# Patient Record
Sex: Female | Born: 1965 | Hispanic: Yes | Marital: Married | Smoking: Never smoker
Health system: Southern US, Community
[De-identification: ages and names within clinical notes are randomized; demographics above are authoritative.]

## PROBLEM LIST (undated history)

## (undated) DIAGNOSIS — M1990 Unspecified osteoarthritis, unspecified site: Secondary | ICD-10-CM

## (undated) DIAGNOSIS — H539 Unspecified visual disturbance: Secondary | ICD-10-CM

## (undated) DIAGNOSIS — E119 Type 2 diabetes mellitus without complications: Secondary | ICD-10-CM

## (undated) DIAGNOSIS — D599 Acquired hemolytic anemia, unspecified: Secondary | ICD-10-CM

## (undated) DIAGNOSIS — D649 Anemia, unspecified: Secondary | ICD-10-CM

## (undated) DIAGNOSIS — T7840XA Allergy, unspecified, initial encounter: Secondary | ICD-10-CM

## (undated) DIAGNOSIS — I10 Essential (primary) hypertension: Secondary | ICD-10-CM

## (undated) DIAGNOSIS — IMO0001 Reserved for inherently not codable concepts without codable children: Secondary | ICD-10-CM

## (undated) HISTORY — DX: Acquired hemolytic anemia, unspecified: D59.9

## (undated) HISTORY — DX: Allergy, unspecified, initial encounter: T78.40XA

## (undated) HISTORY — DX: Unspecified osteoarthritis, unspecified site: M19.90

---
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
## 2000-11-13 ENCOUNTER — Other Ambulatory Visit: Admission: RE | Admit: 2000-11-13 | Discharge: 2000-11-13 | Payer: Self-pay | Admitting: Obstetrics and Gynecology

## 2001-09-08 ENCOUNTER — Other Ambulatory Visit: Admission: RE | Admit: 2001-09-08 | Discharge: 2001-09-08 | Payer: Self-pay | Admitting: Obstetrics and Gynecology

## 2002-04-02 ENCOUNTER — Inpatient Hospital Stay (HOSPITAL_COMMUNITY): Admission: RE | Admit: 2002-04-02 | Discharge: 2002-04-04 | Payer: Self-pay | Admitting: Obstetrics and Gynecology

## 2004-06-17 HISTORY — PX: ANKLE SURGERY: SHX546

## 2004-09-27 ENCOUNTER — Ambulatory Visit (HOSPITAL_COMMUNITY): Admission: RE | Admit: 2004-09-27 | Discharge: 2004-09-27 | Payer: Self-pay | Admitting: Family Medicine

## 2006-11-11 ENCOUNTER — Inpatient Hospital Stay (HOSPITAL_COMMUNITY): Admission: RE | Admit: 2006-11-11 | Discharge: 2006-11-12 | Payer: Self-pay | Admitting: Dentistry

## 2007-01-21 ENCOUNTER — Ambulatory Visit (HOSPITAL_COMMUNITY): Admission: RE | Admit: 2007-01-21 | Discharge: 2007-01-21 | Payer: Self-pay | Admitting: Family Medicine

## 2008-10-11 ENCOUNTER — Ambulatory Visit (HOSPITAL_COMMUNITY): Admission: RE | Admit: 2008-10-11 | Discharge: 2008-10-11 | Payer: Self-pay | Admitting: Internal Medicine

## 2010-07-08 ENCOUNTER — Encounter: Payer: Self-pay | Admitting: Family Medicine

## 2010-07-31 ENCOUNTER — Other Ambulatory Visit: Payer: Self-pay

## 2010-07-31 DIAGNOSIS — Z139 Encounter for screening, unspecified: Secondary | ICD-10-CM

## 2010-08-06 ENCOUNTER — Ambulatory Visit (HOSPITAL_COMMUNITY)
Admission: RE | Admit: 2010-08-06 | Discharge: 2010-08-06 | Disposition: A | Discharge status: Home / Self Care | Payer: Self-pay | Source: Ambulatory Visit | Attending: Family Medicine | Admitting: Family Medicine

## 2010-08-06 DIAGNOSIS — Z1231 Encounter for screening mammogram for malignant neoplasm of breast: Secondary | ICD-10-CM | POA: Insufficient documentation

## 2010-08-06 DIAGNOSIS — Z139 Encounter for screening, unspecified: Secondary | ICD-10-CM

## 2010-10-30 NOTE — Op Note (Signed)
Hannah Dickson, Hannah Dickson  ACCOUNT NO.:  [id number]   MEDICAL RECORD NO.:  [id number]          PATIENT TYPE:  INP   LOCATION:  A313                          FACILITY:  [facility]   PHYSICIAN:  J. Darreld Mclean, M.D. DATE OF BIRTH:  23-Nov-1965   DATE OF PROCEDURE:  11/11/2006  DATE OF DISCHARGE:                               OPERATIVE REPORT   PREOPERATIVE DIAGNOSIS:  Bimalleolar fracture, left ankle.   POSTOPERATIVE DIAGNOSIS:  Bimalleolar fracture, left ankle.   OPERATION PERFORMED:  Open reduction internal fixation, left ankle  fracture medial malleolus side only.   SURGEON:  J. Darreld Mclean, M.D.   ANESTHESIA:  Spinal.   TOURNIQUET TIME:  Please refer to anesthesia record.   INDICATIONS FOR PROCEDURE:  The patient is a 45 year old female, hurt  her ankle around the 15th of May.  She delayed getting treatment.  She  was subsequently seen by Dr. Henderson Newcomer in town.  X-rays were taken.  There  was another delay and I saw her in the office this past Friday.  She had  a bimalleolar fracture of the ankle.  Recommended surgery.  Monday  yesterday was a holiday.  Today was first operative day that we could do  this.   I discussed the risks and imponderables of the procedure.  The patient  can not speak [place]. She had a Nurse, learning disability and I explained it in detail  with them.  Risks and imponderables were discussed and she appeared to  understand.  I explained I will do the medial side, it may be the  lateral depending on the stability.   DESCRIPTION OF PROCEDURE:  The patient was seen in the holding area.  The left ankle was identified as the correct surgical site.  I placed a  mark, she placed a mark.  Translator was present.  Dr. Marcos Eke speaks  Spanish fluently and I talked with her about the anesthesia.  The  patient was brought to the operating room given a spinal anesthesia and  placed supine.  Sandbag was placed up under the left hip.  Tourniquet  placed deflated to left  upper thigh.  She was prepped and draped usual  manner.  We had time out identifying Ms. Hannah Dickson as the patient and left  ankle as correct surgical site.  The leg was elevated and wrapped  circumferentially with Esmarch bandage. Tourniquet inflated to 250 mmHg.  Esmarch bandage removed.  Medial incision made with careful dissection,  fracture site was identified.  Part of the deltoid ligament had gone  within the joint prohibiting bony union.  This was removed.  Ankle was  debrided, settled anatomically in position and a smooth 0.062 K-wire was  inserted.  Then a 3.2 mm drill was used and a 4.5 x 55 mm malleolar long  screw was inserted.  C-arm fluoroscopy unit was used and position was  anatomic for reduction.  Positioning of screw looked good.  Lateral  views were taken.  Fracture was well reduced laterally and medially.  I  elected not to open the lateral side as the mortise looked excellent and  the lateral fragments were in anatomical position.  I don't see how I  could improve it any better.  Wounds medially were  reapproximated using 2-0 chromic, 2-0 plain and skin staples.  Sterile  dressing applied.  Tourniquet deflated.  Please refer anesthesia record  tourniquet time.  Bulky dressing applied.  Posterior splint applied.  Tolerated procedure well.  She will be put in for observation tonight.           ______________________________  J. Darreld Mclean, M.D.     JWK/MEDQ  D:  11/11/2006  T:  11/11/2006  Job:  [id number]

## 2010-10-30 NOTE — H&P (Signed)
NAME:  Hannah Dickson, Hannah Dickson  ACCOUNT NO.:  [id number]   MEDICAL RECORD NO.:  [id number]          PATIENT TYPE:  AMB   LOCATION:  DAY                           FACILITY:  [facility]   PHYSICIAN:  J. Darreld Mclean, M.D. DATE OF BIRTH:  November 27, 1965   DATE OF ADMISSION:  DATE OF DISCHARGE:  LH                              HISTORY & PHYSICAL   CHIEF COMPLAINT:  I broke my ankle.   HISTORY OF PRESENT ILLNESS:  The patient is a 45 year old female who  injured her ankle around May 14 or May 15.  She had pain and tenderness.  She was subsequently seen by Dr. Henderson Newcomer.  Dr. Henderson Newcomer obtained x-rays of  her ankle which shows a bimalleolar fracture of ankle displaced.  There  was a several-day delay.  The patient was seen in my office on May 23.  She comes in with crutches.  She has no splint and no other protective  devices.  The patient speaks very little [place], but she has a  Nurse, learning disability with her.  The patient denies any other injuries.   PAST MEDICAL HISTORY:  Negative.   ALLERGIES:  She has no allergies.   PAST SURGICAL HISTORY:  She is status post C-section twice, one in 2000  and one in 2003.  She does not use alcohol.  She does not smoke.  She  has no family physician.  She is in the process of getting her green  card.  She lives in [place].   PHYSICAL EXAM:  VITAL SIGNS:  BP is 120/72, pulse 72, respirations 16  and afebrile.  GENERAL:  The patient is alert, cooperative and oriented.  HEENT:  Negative.  NECK:  Supple.  LUNGS:  Clear to percussion and auscultation.  HEART:  Regular rate and rhythm without murmur heard.  ABDOMEN:  Soft and nontender without masses.  EXTREMITIES:  Left ankle swollen and tender with decreased range of  motion and pain.  Other extremities within normal limits.  CNS:  Intact.  SKIN:  Intact.   IMPRESSION:  Bimalleolar fracture of the left angle, displaced, 8 days  old.   PLAN:  Open treatment and internal reduction of the left ankle by  surgery  on Tuesday.  I have told the family this is several days old and  is not an emergency.  Operating room is closed on Monday for [facility] and will do this Tuesday, the first available date.  They appeared  to understand and agreed to proceed as outlined.  She will stay  overnight in the hospital the first night and maybe the second night.  We will see how she does after the first night and physical therapy will  see her and get her up and ambulating.  They appeared to understand.  I  went over the risks and imponderables in great detail with them.  Labs  are pending.                                            ______________________________  J. Darreld Mclean, M.D.  JWK/MEDQ  D:  11/10/2006  T:  11/10/2006  Job:  [id number]

## 2010-11-02 NOTE — Op Note (Signed)
   Hannah Dickson, Hannah Dickson                       ACCOUNT NO.:  [id number]   MEDICAL RECORD NO.:  [id number]                   PATIENT TYPE:  INP   LOCATION:  A417                                 FACILITY:  [facility]   PHYSICIAN:  Tilda Burrow, M.D.              DATE OF BIRTH:  1965/08/06   DATE OF PROCEDURE:  04/02/2002  DATE OF DISCHARGE:  04/04/2002                                 OPERATIVE REPORT   PREOPERATIVE DIAGNOSES:  Pregnancy 39 weeks, prior cesarean section now for  trial of labor, elective permanent sterilization.   POSTOPERATIVE DIAGNOSES:  Pregnancy 39 weeks, prior cesarean section now for  trial of labor, elective permanent sterilization, delivered.   PROCEDURE:  Repeat low transverse cervical cesarean section, bilateral  partial salpingectomy.   SURGEON:  Tilda Burrow, M.D.   ASSISTANT:  None.   ANESTHESIA:  Spinal.   COMPLICATIONS:  None.   ESTIMATED BLOOD LOSS:  500 cc   DESCRIPTION OF PROCEDURE:  The patient was taken to the operating room,  prepped and draped for repeat cesarean section with spinal anesthesia  introduced. A lower abdominal Pfannenstiel type incision was repeated with  easy entry into the abdominal cavity. A bladder flap was developed on the  lower uterine segment and transverse uterine incision performed. The fetal  vertex was rotated into the incision and delivered using fundal pressure.  The cord was clamped, the infant passed to the waiting physician. The  placenta was then delivered after cord blood samples obtained. The uterus  was irrigated with an antibiotic containing solution followed by a single  layer of running locking closure of the uterine incision. The abdomen was  irrigated with an antibiotic solution, the anterior peritoneum closed using  2-0 Chromic, the fascia closed using #0 Vicryl and subcutaneous tissues  reapproximated with interrupted 2-0 Chromic. Staple closure of the skin  completed the procedure.   ADDENDUM:  After closure of the uterine incision, attention was directed to  the fallopian tubes which were identifiable on each side in sequence. The  right tube was grasped at its mid portion, elevated and confirmed to its  fimbriated end, doubly ligated around the mid segment knuckle of tube using  2-0 Chromic with excision of the incarcerated mid segment knuckle of tube.  The opposite tube was treated similarly.                                               Tilda Burrow, M.D.    JVF/MEDQ  D:  05/20/2002  T:  05/20/2002  Job:  [id number]   cc:   [facility] Dept.

## 2010-11-02 NOTE — H&P (Signed)
   NAMEJENNE, Dickson                       ACCOUNT NO.:  [id number]   MEDICAL RECORD NO.:  [id number]                   PATIENT TYPE:   LOCATION:                                       FACILITY:   PHYSICIAN:  Tilda Burrow, M.D.              DATE OF BIRTH:  [birth year]   DATE OF ADMISSION:  04/02/2002  DATE OF DISCHARGE:                                HISTORY & PHYSICAL   ADMITTING DIAGNOSIS:  Pregnancy 38-1/[redacted] weeks gestation, prior cesarean  section, not for trial of labor, elected permanent sterilization.   HISTORY OF PRESENT ILLNESS:  This 45 year old Hispanic female gravida 2,  para 1, last menstrual period June 09, 2001, placing [facility] at March 16, 2002 with ultrasound March 25 placing [facility] of December 24 at first  trimester and confirmed later in pregnancy with identical measurements at [redacted]  weeks gestation is admitted at [redacted] weeks gestation for a repeat cesarean  section and tubal ligation.  The patient has signed Medicaid sterilization  consent forms in our office and acknowledges the permanency of requested  procedure.   PAST MEDICAL HISTORY:  Benign.   PAST SURGICAL HISTORY:  Primary cesarean section 2001 after failed induction  for premature rupture of membranes at term.   ALLERGIES:  None.   SOCIAL HISTORY:  Married, works at [employer].   PHYSICAL EXAMINATION:  VITAL SIGNS:  Height 4 feet 10 inches, weight 160  pounds, blood pressure 90/58.  GENERAL:  A healthy, cheerful, energetic Hispanic female alert and oriented  x3.  Pupils equal, round, and reactive.  NECK:  Supple.  CHEST:  Clear to auscultation.  ABDOMEN:  A 37-cm fundal height, gravid uterus, vertex presentation of  infant.  Cervix closed, long, and high at last exam.   PRENATAL LABORATORY DATA:  Blood type O+, urine drug screen negative,  rubella immunity.  Present hemoglobin 12, hematocrit 38.  Hepatitis, HIV,  RPR, GC, Chlamydia all negative.  MSAFP normal.  Glucose tolerance test  not  performed as scheduled.  At 36 weeks, a modified glucose challenge test with  a soft drink resulted in a one-hour blood sugar of 237 but the patient's  subsequent visits never showed glucosuria.   IMPRESSION:  1. Pregnancy 39 weeks.  2. Glucose intolerance third trimester.  3.     Desire for elective permanent sterilization.  4. Repeat cesarean section.   PLAN:  Cesarean section with sterilization on April 02, 2002.                                               Tilda Burrow, M.D.    JVF/MEDQ  D:  03/23/2002  T:  03/24/2002  Job:  [id number]   cc:   [facility] Department

## 2010-11-02 NOTE — Discharge Summary (Signed)
Hannah Dickson, MOOR  ACCOUNT NO.:  [id number]   MEDICAL RECORD NO.:  [id number]          PATIENT TYPE:  INP   LOCATION:  A313                          FACILITY:  [facility]   PHYSICIAN:  J. Darreld Mclean, M.D. DATE OF BIRTH:  [birth year]   DATE OF ADMISSION:  11/11/2006  DATE OF DISCHARGE:  05/28/2008LH                               DISCHARGE SUMMARY   DISCHARGE DIAGNOSIS:  Bimalleolar fracture of the ankle on the left.   CONDITION ON DISCHARGE:  Improved.  Prognosis is good.   HOSPITAL COURSE:  The patient had a fracture of her ankle on May 14, or  May 15.  I saw her in the office for the first time on May 23.  Surgery  was scheduled for May 26, after the [facility] Day holiday.  She had open  reduction internal fixation of the left ankle of the medial malleolus  only.  The lateral malleolus was in good position and alignment.  Surgery was done on May 27, the day of admission.  She tolerated this  well.  On the next day, she was doing well and had very little pain,  getting about with physical therapy.  She was instructed to have limited  weightbearing on foot to none.   DISCHARGE MEDICATION:  Vicodin ES p.r.n. pain.   FOLLOW UP:  I will see her in the office on June 11.  She can contact me  through the office and hospital beeper system numbers have been  provided.                                            ______________________________  [name]. Darreld Mclean, M.D.     JWK/MEDQ  D:  12/04/2006  T:  12/04/2006  Job:  [id number]

## 2011-09-12 ENCOUNTER — Other Ambulatory Visit (HOSPITAL_COMMUNITY): Payer: Self-pay | Admitting: Family Medicine

## 2011-09-12 DIAGNOSIS — Z139 Encounter for screening, unspecified: Secondary | ICD-10-CM

## 2011-09-26 ENCOUNTER — Ambulatory Visit (HOSPITAL_COMMUNITY)
Admission: RE | Admit: 2011-09-26 | Discharge: 2011-09-26 | Disposition: A | Discharge status: Home / Self Care | Payer: Self-pay | Source: Ambulatory Visit | Attending: Family Medicine | Admitting: Family Medicine

## 2011-09-26 DIAGNOSIS — Z139 Encounter for screening, unspecified: Secondary | ICD-10-CM

## 2012-06-11 ENCOUNTER — Ambulatory Visit: Payer: Self-pay | Admitting: Family Medicine

## 2012-06-11 VITALS — BP 132/85 | HR 64 | Temp 98.0°F | Resp 17 | Ht 58.5 in | Wt 140.0 lb

## 2012-06-11 DIAGNOSIS — H6690 Otitis media, unspecified, unspecified ear: Secondary | ICD-10-CM

## 2012-06-11 DIAGNOSIS — H6120 Impacted cerumen, unspecified ear: Secondary | ICD-10-CM

## 2012-06-11 MED ORDER — AMOXICILLIN 875 MG PO TABS: 875.0000 mg | ORAL_TABLET | Freq: Two times a day (BID) | ORAL | Status: DC

## 2012-06-11 NOTE — Progress Notes (Signed)
  [facility]:  Office Visit  Chief Complaint:  Chief Complaint  Patient presents with  . Otalgia    left side 2 days     HPI: Hannah Dickson is a 46 y.o. female who complains of  2 day history, left ear pain. Can't sleep. She has not had fevers. Has had pain going down her neck and also HAs. No other sxs. No recent swimming or underwater exposure/   Past Medical History  Diagnosis Date  . Allergy   . Arthritis    History reviewed. No pertinent past surgical history. History   Social History  . Marital Status: Married    Spouse Name: N/A    Number of Children: N/A  . Years of Education: N/A   Social History Main Topics  . Smoking status: Never Smoker   . Smokeless tobacco: None  . Alcohol Use: No  . Drug Use: No  . Sexually Active: No   Other Topics Concern  . None   Social History Narrative  . None   History reviewed. No pertinent family history. No Known Allergies Prior to Admission medications   Not on File     ROS: The patient denies fevers, chills, night sweats, unintentional weight loss, chest pain, palpitations, wheezing, dyspnea on exertion, nausea, vomiting, abdominal pain, dysuria, hematuria, melena, numbness, weakness, or tingling.   All other systems have been reviewed and were otherwise negative with the exception of those mentioned in the HPI and as above.    PHYSICAL EXAM: Filed Vitals:   06/11/12 1054  BP: 132/85  Pulse: 64  Temp: 98 F (36.7 C)  Resp: 17   Filed Vitals:   06/11/12 1054  Height: 4' 10.5" (1.486 m)  Weight: 140 lb (63.504 kg)   Body mass index is 28.76 kg/(m^2).  General: Alert, no acute distress HEENT:  Normocephalic, atraumatic, oropharynx patent. Left TM unable to visualize, there is fluid, and black/brown cerumen. After disimpaction TM not visualized, non present.  She has exudates Cardiovascular:  Regular rate and rhythm, no rubs murmurs or gallops.  No Carotid bruits, radial pulse  intact. No pedal edema.  Respiratory: Clear to auscultation bilaterally.  No wheezes, rales, or rhonchi.  No cyanosis, no use of accessory musculature GI: No organomegaly, abdomen is soft and non-tender, positive bowel sounds.  No masses. Skin: No rashes. Neurologic: Facial musculature symmetric. Psychiatric: Patient is appropriate throughout our interaction. Lymphatic: No cervical lymphadenopathy Musculoskeletal: Gait intact.   LABS: No results found for this or any previous visit.   EKG/XRAY:   Primary read interpreted by Dr. Conley Rolls at [facility].   ASSESSMENT/PLAN: Encounter Diagnoses  Name Primary?  . Acute otitis media Yes  . Cerumen impaction    Rx Amoxacillin 875 mg PO BID Work note given 06/12/12, return to work on 06/13/12 F/u prn     ,  PHUONG, DO 06/11/2012 12:18 PM

## 2012-12-03 ENCOUNTER — Other Ambulatory Visit (HOSPITAL_COMMUNITY): Payer: Self-pay | Admitting: *Deleted

## 2012-12-03 DIAGNOSIS — Z09 Encounter for follow-up examination after completed treatment for conditions other than malignant neoplasm: Secondary | ICD-10-CM

## 2012-12-10 ENCOUNTER — Ambulatory Visit (HOSPITAL_COMMUNITY)
Admission: RE | Admit: 2012-12-10 | Discharge: 2012-12-10 | Disposition: A | Discharge status: Home / Self Care | Payer: Self-pay | Source: Ambulatory Visit | Attending: *Deleted | Admitting: *Deleted

## 2012-12-10 DIAGNOSIS — Z09 Encounter for follow-up examination after completed treatment for conditions other than malignant neoplasm: Secondary | ICD-10-CM

## 2014-01-25 ENCOUNTER — Other Ambulatory Visit (HOSPITAL_COMMUNITY): Payer: Self-pay | Admitting: *Deleted

## 2014-01-25 DIAGNOSIS — Z1231 Encounter for screening mammogram for malignant neoplasm of breast: Secondary | ICD-10-CM

## 2014-01-31 ENCOUNTER — Ambulatory Visit (HOSPITAL_COMMUNITY)
Admission: RE | Admit: 2014-01-31 | Discharge: 2014-01-31 | Disposition: A | Discharge status: Home / Self Care | Payer: PRIVATE HEALTH INSURANCE | Source: Ambulatory Visit | Attending: *Deleted | Admitting: *Deleted

## 2014-01-31 DIAGNOSIS — Z1231 Encounter for screening mammogram for malignant neoplasm of breast: Secondary | ICD-10-CM

## 2015-02-17 ENCOUNTER — Inpatient Hospital Stay (HOSPITAL_COMMUNITY): Payer: PRIVATE HEALTH INSURANCE

## 2015-02-17 ENCOUNTER — Inpatient Hospital Stay (HOSPITAL_COMMUNITY)
Admission: AD | Admit: 2015-02-17 | Discharge: 2015-02-20 | DRG: 123 | Disposition: A | Discharge status: Home / Self Care | Payer: Self-pay | Source: Ambulatory Visit | Attending: Internal Medicine | Admitting: Internal Medicine

## 2015-02-17 ENCOUNTER — Encounter (HOSPITAL_COMMUNITY): Payer: Self-pay | Admitting: General Practice

## 2015-02-17 DIAGNOSIS — Z794 Long term (current) use of insulin: Secondary | ICD-10-CM

## 2015-02-17 DIAGNOSIS — H469 Unspecified optic neuritis: Principal | ICD-10-CM | POA: Insufficient documentation

## 2015-02-17 DIAGNOSIS — Z79899 Other long term (current) drug therapy: Secondary | ICD-10-CM

## 2015-02-17 DIAGNOSIS — H547 Unspecified visual loss: Secondary | ICD-10-CM

## 2015-02-17 DIAGNOSIS — IMO0002 Reserved for concepts with insufficient information to code with codable children: Secondary | ICD-10-CM

## 2015-02-17 DIAGNOSIS — E119 Type 2 diabetes mellitus without complications: Secondary | ICD-10-CM | POA: Diagnosis present

## 2015-02-17 DIAGNOSIS — M1990 Unspecified osteoarthritis, unspecified site: Secondary | ICD-10-CM | POA: Diagnosis present

## 2015-02-17 DIAGNOSIS — E1165 Type 2 diabetes mellitus with hyperglycemia: Secondary | ICD-10-CM

## 2015-02-17 HISTORY — DX: Type 2 diabetes mellitus without complications: E11.9

## 2015-02-17 LAB — COMPREHENSIVE METABOLIC PANEL
ALT: 37 U/L (ref 14–54)
ANION GAP: 7 (ref 5–15)
AST: 33 U/L (ref 15–41)
Albumin: 4 g/dL (ref 3.5–5.0)
Alkaline Phosphatase: 86 U/L (ref 38–126)
BILIRUBIN TOTAL: 0.4 mg/dL (ref 0.3–1.2)
BUN: 10 mg/dL (ref 6–20)
CHLORIDE: 105 mmol/L (ref 101–111)
CO2: 24 mmol/L (ref 22–32)
Calcium: 8.7 mg/dL — ABNORMAL LOW (ref 8.9–10.3)
Creatinine, Ser: 0.59 mg/dL (ref 0.44–1.00)
Glucose, Bld: 117 mg/dL — ABNORMAL HIGH (ref 65–99)
POTASSIUM: 4 mmol/L (ref 3.5–5.1)
Sodium: 136 mmol/L (ref 135–145)
TOTAL PROTEIN: 7 g/dL (ref 6.5–8.1)

## 2015-02-17 LAB — CBC
HEMATOCRIT: 32.7 % — AB (ref 36.0–46.0)
Hemoglobin: 10.9 g/dL — ABNORMAL LOW (ref 12.0–15.0)
MCH: 30.4 pg (ref 26.0–34.0)
MCHC: 33.3 g/dL (ref 30.0–36.0)
MCV: 91.1 fL (ref 78.0–100.0)
PLATELETS: 139 10*3/uL — AB (ref 150–400)
RBC: 3.59 MIL/uL — ABNORMAL LOW (ref 3.87–5.11)
RDW: 14.1 % (ref 11.5–15.5)
WBC: 2.5 10*3/uL — ABNORMAL LOW (ref 4.0–10.5)

## 2015-02-17 LAB — GLUCOSE, CAPILLARY: GLUCOSE-CAPILLARY: 125 mg/dL — AB (ref 65–99)

## 2015-02-17 MED ORDER — ALUM & MAG HYDROXIDE-SIMETH 200-200-20 MG/5ML PO SUSP: 30.0000 mL | Freq: Four times a day (QID) | ORAL | Status: DC | PRN

## 2015-02-17 MED ORDER — SODIUM CHLORIDE 0.9 % IJ SOLN: 3.0000 mL | INTRAMUSCULAR | Status: DC | PRN

## 2015-02-17 MED ORDER — ACETAMINOPHEN 650 MG RE SUPP: 650.0000 mg | Freq: Four times a day (QID) | RECTAL | Status: DC | PRN

## 2015-02-17 MED ORDER — ONDANSETRON HCL 4 MG/2ML IJ SOLN: 4.0000 mg | Freq: Four times a day (QID) | INTRAMUSCULAR | Status: DC | PRN

## 2015-02-17 MED ORDER — ONDANSETRON HCL 4 MG PO TABS: 4.0000 mg | ORAL_TABLET | Freq: Four times a day (QID) | ORAL | Status: DC | PRN

## 2015-02-17 MED ORDER — SODIUM CHLORIDE 0.9 % IV SOLN: 250.0000 mL | INTRAVENOUS | Status: DC | PRN

## 2015-02-17 MED ORDER — ENOXAPARIN SODIUM 40 MG/0.4ML ~~LOC~~ SOLN
40.0000 mg | SUBCUTANEOUS | Status: DC
  Administered 2015-02-17 – 2015-02-19 (×3): 40 mg via SUBCUTANEOUS
  Filled 2015-02-17 (×3): qty 0.4

## 2015-02-17 MED ORDER — INSULIN ASPART 100 UNIT/ML ~~LOC~~ SOLN
0.0000 [IU] | Freq: Three times a day (TID) | SUBCUTANEOUS | Status: DC
  Administered 2015-02-18: 2 [IU] via SUBCUTANEOUS
  Administered 2015-02-18: 11 [IU] via SUBCUTANEOUS
  Administered 2015-02-18: 5 [IU] via SUBCUTANEOUS
  Administered 2015-02-19 – 2015-02-20 (×4): 8 [IU] via SUBCUTANEOUS

## 2015-02-17 MED ORDER — INSULIN ASPART 100 UNIT/ML ~~LOC~~ SOLN
0.0000 [IU] | Freq: Every day | SUBCUTANEOUS | Status: DC
  Administered 2015-02-18: 4 [IU] via SUBCUTANEOUS
  Administered 2015-02-19: 5 [IU] via SUBCUTANEOUS

## 2015-02-17 MED ORDER — SODIUM CHLORIDE 0.9 % IJ SOLN
3.0000 mL | Freq: Two times a day (BID) | INTRAMUSCULAR | Status: DC
  Administered 2015-02-17 – 2015-02-20 (×6): 3 mL via INTRAVENOUS

## 2015-02-17 MED ORDER — ACETAMINOPHEN 325 MG PO TABS
650.0000 mg | ORAL_TABLET | Freq: Four times a day (QID) | ORAL | Status: DC | PRN
  Administered 2015-02-18: 650 mg via ORAL
  Filled 2015-02-17: qty 2

## 2015-02-17 NOTE — Consult Note (Signed)
NEURO HOSPITALIST CONSULT NOTE   Referring physician: Dr Vanessa Barbara Reason for Consult: blurred vision and pain right eye  HPI:                                                                                                                                          Hannah Dickson is an 49 y.o. female with a past medical history that is relevant for DM type II, and arthritis, sent as a direct admission due to new onset of the above stated symptoms. Patient indicated that 10 or 12 days ago she developed acute onset of pain in the right eye that was followed by blurred vision and have been present ever since. Never had similar symptoms before. She sad that in the beginning the pain will get worse with eye movements but no anymore. Denies associated vision loss, periorbital swelling, HA, vertigo, double vision, focal weakness, slurred speech, imbalance, or language impairment. No eye trauma, fever, or infection. She was evaluated today by ophthalmology and sent to the hospital due to concern for optic neuritis.    Past Medical History  Diagnosis Date  . Allergy   . Arthritis   . Diabetes mellitus without complication     Past Surgical History  Procedure Laterality Date  . Ankle surgery Left 2006    History reviewed. No pertinent family history.  Family History: no MS, brain tumor, epilepsy, or brain aneurysm   Social History:  reports that she has never smoked. She has never used smokeless tobacco. She reports that she does not drink alcohol or use illicit drugs.  No Known Allergies  MEDICATIONS:                                                                                                                     I have reviewed the patient's current medications.   ROS:  History obtained from chart review and the  patient  General ROS: negative for - chills, fatigue, fever, night sweats, or weight loss Psychological ROS: negative for - behavioral disorder, hallucinations, memory difficulties, mood swings or suicidal ideation Ophthalmic ROS: negative for - double vision or or loss of vision ENT ROS: negative for - epistaxis, nasal discharge, oral lesions, sore throat, tinnitus or vertigo Allergy and Immunology ROS: negative for - hives or itchy/watery eyes Hematological and Lymphatic ROS: negative for - bleeding problems, bruising or swollen lymph nodes Endocrine ROS: negative for - galactorrhea, hair pattern changes, polydipsia/polyuria or temperature intolerance Respiratory ROS: negative for - cough, hemoptysis, shortness of breath or wheezing Cardiovascular ROS: negative for - chest pain, dyspnea on exertion, edema or irregular heartbeat Gastrointestinal ROS: negative for - abdominal pain, diarrhea, hematemesis, nausea/vomiting or stool incontinence Genito-Urinary ROS: negative for - dysuria, hematuria, incontinence or urinary frequency/urgency Musculoskeletal ROS: negative for - joint swelling or muscular weakness Neurological ROS: as noted in HPI Dermatological ROS: negative for rash and skin lesion changes   Physical exam:  Constitutional: well developed, pleasant female in no apparent distress. Blood pressure 147/77, pulse 69, temperature 98.2 F (36.8 C), temperature source Oral, resp. rate 18, height  (1.372 m), weight 66.8 kg (147 lb 4.3 oz), SpO2 100 %. Eyes: no jaundice, proptosis, or exophthalmos.  Head: normocephalic. Neck: supple, no bruits, no JVD. Cardiac: no murmurs. Lungs: clear. Abdomen: soft, no tender, no mass. Extremities: no edema, clubbing, or cyanosis.  Skin: no rash  Neurologic Examination:                                                                                                      General: Mental Status: Alert, oriented, thought content appropriate.   Speech fluent without evidence of aphasia.  Able to follow 3 step commands without difficulty. Cranial Nerves: II: Discs flat bilaterally;  Visual acuity 20/20 bilaterally, right APD, visual fields grossly normal, pupils equal, round, reactive to light and accommodation III,IV, VI: ptosis not present, extra-ocular motions intact bilaterally V,VII: smile symmetric, facial light touch sensation normal bilaterally VIII: hearing normal bilaterally IX,X: uvula rises symmetrically XI: bilateral shoulder shrug XII: midline tongue extension without atrophy or fasciculations Motor: Right : Upper extremity   5/5    Left:     Upper extremity   5/5  Lower extremity   5/5     Lower extremity   5/5 Tone and bulk:normal tone throughout; no atrophy noted Sensory: Pinprick and light touch intact throughout, bilaterally Deep Tendon Reflexes:  Right: Upper Extremity   Left: Upper extremity   biceps (C-5 to C-6) 2/4   biceps (C-5 to C-6) 2/4 tricep (C7) 2/4    triceps (C7) 2/4 Brachioradialis (C6) 2/4  Brachioradialis (C6) 2/4  Lower Extremity Lower Extremity  quadriceps (L-2 to L-4) 2/4   quadriceps (L-2 to L-4) 2/4 Achilles (S1) 2/4   Achilles (S1) 2/4  Plantars: Right: downgoing   Left: downgoing Cerebellar: normal finger-to-nose,  normal heel-to-shin test Gait:  No ataxia  No results found for: CHOL  No results found  for this or any previous visit (from the past 48 hour(s)).  No results found.  Assessment/Plan: 49 y/o with new onset painful blurred vision right eye that is improving. Visual acuity intact but seems to have a right APD. Concern for right optic neuritis. Recommend: MRI brain and orbits with and without contrast. Patient is improving, hold steroids after MRI. Will follow up.  Wyatt Portela, MD 02/17/2015, 5:02 PM

## 2015-02-17 NOTE — Progress Notes (Signed)
Patient direct admit from home oriented and policy on her personal items  Given MD made aware that patient is here

## 2015-02-17 NOTE — H&P (Signed)
[facility] History and Physical  Hannah Dickson BJY:[id number] DOB: December 05, 1965 DOA: 02/17/2015  Referring physician:  PCP: Pcp Not In System   Chief Complaint: Right eye blurriness  HPI: Hannah Dickson is a 49 y.o. female with a past medical history of diabetes mellitus who presents as a direct admit from Dr. Allyne Gee office for further evaluation of right eye visual loss. Patient reporting a 10 day history of right-sided blurry vision associated with ocular pain. She states that symptoms have actually improved over the past several days. She also complains of associated fatigue particularly at the end of the day. She denies numbness, tingling, muscle weakness, spasticity, gait abnormalities, urinary symptoms. There was concern that symptoms could be secondary to an mass for which Dr Allyne Gee requested MRI of brain and neurology consultation. She denies having prior episodes of visual loss or blurriness.                                             Review of Systems:  Constitutional:  No weight loss, night sweats, Fevers, chills, positive for fatigue.  HEENT:  No headaches, Difficulty swallowing,Tooth/dental problems,Sore throat,  No sneezing, itching, ear ache, nasal congestion, post nasal drip,  Cardio-vascular:  No chest pain, Orthopnea, PND, swelling in lower extremities, anasarca, dizziness, palpitations  GI:  No heartburn, indigestion, abdominal pain, nausea, vomiting, diarrhea, change in bowel habits, loss of appetite  Resp:  No shortness of breath with exertion or at rest. No excess mucus, no productive cough, No non-productive cough, No coughing up of blood.No change in color of mucus.No wheezing.No chest wall deformity  Skin:  no rash or lesions.  GU:  no dysuria, change in color of urine, no urgency or frequency. No flank pain.  Musculoskeletal:  No joint pain or swelling. No decreased range of motion. No back pain.  Psych:  No change in mood or  affect. No depression or anxiety. No memory loss.   Past Medical History  Diagnosis Date  . Allergy   . Arthritis   . Diabetes mellitus without complication    Past Surgical History  Procedure Laterality Date  . Ankle surgery Left 2006   Social History:  reports that she has never smoked. She has never used smokeless tobacco. She reports that she does not drink alcohol or use illicit drugs.  No Known Allergies  History reviewed. No pertinent family history.   Prior to Admission medications   Medication Sig Start Date End Date Taking? Authorizing Provider  amoxicillin (AMOXIL) 875 MG tablet Take 1 tablet (875 mg total) by mouth 2 (two) times daily. 06/11/12   Thao P Le, DO   Physical Exam: Filed Vitals:   02/17/15 1508  BP: 147/77  Pulse: 69  Temp: 98.2 F (36.8 C)  TempSrc: Oral  Resp: 18  Height:  (1.372 m)  Weight: 66.8 kg (147 lb 4.3 oz)  SpO2: 100%    Wt Readings from Last 3 Encounters:  02/17/15 66.8 kg (147 lb 4.3 oz)  06/11/12 63.504 kg (140 lb)    General:  Appears calm and comfortable, in no acute distress Eyes: PERRL, normal lids, irises & conjunctiva ENT: grossly normal hearing, lips & tongue Neck: no LAD, masses or thyromegaly Cardiovascular: RRR, no m/r/g. No LE edema. Telemetry: SR, no arrhythmias  Respiratory: CTA bilaterally, no w/r/r. Normal respiratory effort. Abdomen: soft, ntnd Skin: no rash or induration  seen on limited exam Musculoskeletal: grossly normal tone BUE/BLE Psychiatric: grossly normal mood and affect, speech fluent and appropriate Neurologic: grossly non-focal. She had decreased visual acuity of her right eye, could not read letters beyond 12 inches from her eye.           Labs on Admission:  Basic Metabolic Panel: No results for input(s): NA, K, CL, CO2, GLUCOSE, BUN, CREATININE, CALCIUM, MG, PHOS in the last 168 hours. Liver Function Tests: No results for input(s): AST, ALT, ALKPHOS, BILITOT, PROT, ALBUMIN in the last  168 hours. No results for input(s): LIPASE, AMYLASE in the last 168 hours. No results for input(s): AMMONIA in the last 168 hours. CBC: No results for input(s): WBC, NEUTROABS, HGB, HCT, MCV, PLT in the last 168 hours. Cardiac Enzymes: No results for input(s): CKTOTAL, CKMB, CKMBINDEX, TROPONINI in the last 168 hours.  BNP (last 3 results) No results for input(s): BNP in the last 8760 hours.  ProBNP (last 3 results) No results for input(s): PROBNP in the last 8760 hours.  CBG: No results for input(s): GLUCAP in the last 168 hours.  Radiological Exams on Admission: No results found.    Assessment/Plan Principal Problem:   Visual loss Active Problems:   Diabetes mellitus, type 2   1. Unilateral visual changes. Patient presenting with complaints of blurry vision involving her right eye that is associated with pain. She denies complete loss of vision to involved eye. Today she was seen and evaluated by Dr. Allyne Gee of ophthalmology. There was concerns that this may represent multiple sclerosis. She denies any previous episode of visual loss and her only other symptom is fatigue. Discussed case with Dr. Leroy Kennedy of neurology and will pursue further workup with an MRI of brain with and without contrast along with MRI of orbits with and without contrast. Dr Leroy Kennedy recommended holding steroids for now until MRI results are available. 2. Type 2 diabetes mellitus. Will hold metformin, provide sliding scale coverage for now. 3. DVT prophylaxis. Lovenox   Code Status: Full code DVT Prophylaxis: Lovenox Family Communication: I spoke with her husband who was present at bedside Disposition Plan: Will admit to the inpatient service.  Time spent: 55 min  Jeralyn Bennett [facility] Pager [phone]

## 2015-02-18 DIAGNOSIS — H547 Unspecified visual loss: Secondary | ICD-10-CM

## 2015-02-18 DIAGNOSIS — E119 Type 2 diabetes mellitus without complications: Secondary | ICD-10-CM

## 2015-02-18 DIAGNOSIS — H469 Unspecified optic neuritis: Secondary | ICD-10-CM | POA: Insufficient documentation

## 2015-02-18 LAB — SEDIMENTATION RATE: SED RATE: 101 mm/h — AB (ref 0–22)

## 2015-02-18 LAB — BASIC METABOLIC PANEL
Anion gap: 8 (ref 5–15)
BUN: 11 mg/dL (ref 6–20)
CO2: 23 mmol/L (ref 22–32)
CREATININE: 0.81 mg/dL (ref 0.44–1.00)
Calcium: 8.8 mg/dL — ABNORMAL LOW (ref 8.9–10.3)
Chloride: 103 mmol/L (ref 101–111)
Glucose, Bld: 171 mg/dL — ABNORMAL HIGH (ref 65–99)
Potassium: 4.6 mmol/L (ref 3.5–5.1)
SODIUM: 134 mmol/L — AB (ref 135–145)

## 2015-02-18 LAB — HIV ANTIBODY (ROUTINE TESTING W REFLEX): HIV Screen 4th Generation wRfx: NONREACTIVE

## 2015-02-18 LAB — GLUCOSE, CAPILLARY
GLUCOSE-CAPILLARY: 308 mg/dL — AB (ref 65–99)
Glucose-Capillary: 144 mg/dL — ABNORMAL HIGH (ref 65–99)
Glucose-Capillary: 231 mg/dL — ABNORMAL HIGH (ref 65–99)
Glucose-Capillary: 320 mg/dL — ABNORMAL HIGH (ref 65–99)

## 2015-02-18 LAB — CBC
HCT: 34.5 % — ABNORMAL LOW (ref 36.0–46.0)
Hemoglobin: 11.6 g/dL — ABNORMAL LOW (ref 12.0–15.0)
MCH: 30.9 pg (ref 26.0–34.0)
MCHC: 33.6 g/dL (ref 30.0–36.0)
MCV: 92 fL (ref 78.0–100.0)
Platelets: 129 10*3/uL — ABNORMAL LOW (ref 150–400)
RBC: 3.75 MIL/uL — ABNORMAL LOW (ref 3.87–5.11)
RDW: 14.5 % (ref 11.5–15.5)
WBC: 2.7 10*3/uL — AB (ref 4.0–10.5)

## 2015-02-18 LAB — C-REACTIVE PROTEIN: CRP: 0.7 mg/dL (ref <1.0)

## 2015-02-18 LAB — RPR: RPR: NONREACTIVE

## 2015-02-18 LAB — ANTI-DNA ANTIBODY, DOUBLE-STRANDED: ds DNA Ab: 5 IU/mL (ref 0–9)

## 2015-02-18 LAB — VITAMIN B12: Vitamin B-12: 4812 pg/mL — ABNORMAL HIGH (ref 180–914)

## 2015-02-18 MED ORDER — GADOBENATE DIMEGLUMINE 529 MG/ML IV SOLN
10.0000 mL | Freq: Once | INTRAVENOUS | Status: AC | PRN
  Administered 2015-02-18: 10 mL via INTRAVENOUS

## 2015-02-18 MED ORDER — SODIUM CHLORIDE 0.9 % IV SOLN
1000.0000 mg | Freq: Every day | INTRAVENOUS | Status: AC
  Administered 2015-02-18 – 2015-02-20 (×3): 1000 mg via INTRAVENOUS
  Filled 2015-02-18 (×3): qty 8

## 2015-02-18 NOTE — Progress Notes (Signed)
NEURO HOSPITALIST PROGRESS NOTE   SUBJECTIVE:                                                                                                                        Had an uneventful night. Stated that her vision is much improved and has no pain in the right eye. Otherwise, no new neurological complains. MRI brain and orbits with and without contrast were personally reviewed: limited evaluation due to artifact but evidence of increased signal and enhancement of the posterior intraorbital segment, intracanicular and, proximal cisternal segment right eye consistent with ON.   OBJECTIVE:                                                                                                                           Vital signs in last 24 hours: Temp:  [98 F (36.7 C)-98.2 F (36.8 C)] 98 F (36.7 C) (09/03 1610) Pulse Rate:  [57-69] 57 (09/03 0608) Resp:  [18] 18 (09/03 0608) BP: (103-147)/(57-77) 110/76 mmHg (09/03 0608) SpO2:  [98 %-100 %] 98 % (09/03 0608) Weight:  [66.8 kg (147 lb 4.3 oz)] 66.8 kg (147 lb 4.3 oz) (09/02 1508)  Intake/Output from previous day: 09/02 0701 - 09/03 0700 In: 360 [P.O.:360] Out: -  Intake/Output this shift:   Nutritional status: Diet Carb Modified Fluid consistency:: Thin; Room service appropriate?: Yes  Past Medical History  Diagnosis Date  . Allergy   . Arthritis   . Diabetes mellitus without complication    Physical exam:  Constitutional: well developed, pleasant female in no apparent distress. Blood pressure 147/77, pulse 69, temperature 98.2 F (36.8 C), temperature source Oral, resp. rate 18, height 4\' 6"  (1.372 m), weight 66.8 kg (147 lb 4.3 oz), SpO2 100 %. Eyes: no jaundice, proptosis, or exophthalmos.  Head: normocephalic. Neck: supple, no bruits, no JVD. Cardiac: no murmurs. Lungs: clear. Abdomen: soft, no tender, no mass. Extremities: no edema, clubbing, or cyanosis.  Skin: no rash   Neurologic  Exam:  General: Mental Status: Alert, oriented, thought content appropriate. Speech fluent without evidence of aphasia. Able to follow 3 step commands without difficulty. Cranial Nerves: II: Discs flat bilaterally; Visual acuity 20/20 bilaterally, right APD, visual fields grossly normal, pupils equal, round, reactive to  light and accommodation III,IV, VI: ptosis not present, extra-ocular motions intact bilaterally V,VII: smile symmetric, facial light touch sensation normal bilaterally VIII: hearing normal bilaterally IX,X: uvula rises symmetrically XI: bilateral shoulder shrug XII: midline tongue extension without atrophy or fasciculations Motor: Right :Upper extremity 5/5Left: Upper extremity 5/5 Lower extremity 5/5Lower extremity 5/5 Tone and bulk:normal tone throughout; no atrophy noted Sensory: Pinprick and light touch intact throughout, bilaterally Deep Tendon Reflexes:  Right: Upper Extremity Left: Upper extremity   biceps (C-5 to C-6) 2/4 biceps (C-5 to C-6) 2/4 tricep (C7) 2/4triceps (C7) 2/4 Brachioradialis (C6) 2/4Brachioradialis (C6) 2/4  Lower Extremity Lower Extremity  quadriceps (L-2 to L-4) 2/4 quadriceps (L-2 to L-4) 2/4 Achilles (S1) 2/4Achilles (S1) 2/4  Plantars: Right: downgoingLeft: downgoing Cerebellar: normal finger-to-nose, normal heel-to-shin test Gait:  No ataxia  Lab Results: No results found for: CHOL Lipid Panel No results for input(s): CHOL, TRIG, HDL, CHOLHDL, VLDL, LDLCALC in the last 72 hours.  Studies/Results: Mr Laqueta Jean Wo Contrast  02/18/2015   CLINICAL DATA:  Ten day history of RIGHT-sided blurry vision and ocular pain, improving over last  several days. Fatigue. History of diabetes.  EXAM: MRI HEAD AND ORBITS WITHOUT AND WITH CONTRAST  TECHNIQUE: Multiplanar, multiecho pulse sequences of the brain and surrounding structures were obtained without and with intravenous contrast. Multiplanar, multiecho pulse sequences of the orbits and surrounding structures were obtained including fat saturation techniques, before and after intravenous contrast administration.  CONTRAST:  10mL MULTIHANCE GADOBENATE DIMEGLUMINE 529 MG/ML IV SOLN  COMPARISON:  None.  FINDINGS: MRI HEAD FINDINGS  The ventricles and sulci are normal for patient's age. No abnormal parenchymal signal, mass lesions, mass effect. No abnormal parenchymal enhancement. No reduced diffusion to suggest acute ischemia. No susceptibility artifact to suggest hemorrhage.  No abnormal extra-axial fluid collections. No extra-axial masses nor leptomeningeal enhancement. Normal major intracranial vascular flow voids seen at the skull base.  Extensive susceptibility artifact about the oral cavity. Visualized paranasal sinuses and mastoid air cells are well-aerated. No suspicious calvarial bone marrow signal. No abnormal sellar expansion. Craniocervical junction maintained.  MRI ORBITS FINDINGS  Limited evaluation due to artifact about the oral cavity likely represent braces or dental amalgam.  RIGHT T2 signal and enhancement of the RIGHT optic nerve (posterior intraorbital segment, intracanicular and, proximal cisternal segment). Faint enhancement of the RIGHT retina. No orbital mass. Optic nerve sleeves are normal. Ocular globes intact, lenses are located. Preservation orbital fat. Normal appearance the extraocular muscles. Superior ophthalmic veins are not enlarged.  IMPRESSION: MRI HEAD: Normal MRI of the brain with and without contrast.  MRI ORBITS: Limited evaluation due to artifact about the oral cavity.  Enhancing RIGHT optic nerve compatible with optic neuritis.   Electronically Signed   By: Awilda Metro M.D.   On: 02/18/2015 01:50   Mr Birdie Hopes Wo/w Cm  02/18/2015   CLINICAL DATA:  Ten day history of RIGHT-sided blurry vision and ocular pain, improving over last several days. Fatigue. History of diabetes.  EXAM: MRI HEAD AND ORBITS WITHOUT AND WITH CONTRAST  TECHNIQUE: Multiplanar, multiecho pulse sequences of the brain and surrounding structures were obtained without and with intravenous contrast. Multiplanar, multiecho pulse sequences of the orbits and surrounding structures were obtained including fat saturation techniques, before and after intravenous contrast administration.  CONTRAST:  10mL MULTIHANCE GADOBENATE DIMEGLUMINE 529 MG/ML IV SOLN  COMPARISON:  None.  FINDINGS: MRI HEAD FINDINGS  The ventricles and sulci are normal for patient's age. No abnormal parenchymal signal, mass lesions, mass  effect. No abnormal parenchymal enhancement. No reduced diffusion to suggest acute ischemia. No susceptibility artifact to suggest hemorrhage.  No abnormal extra-axial fluid collections. No extra-axial masses nor leptomeningeal enhancement. Normal major intracranial vascular flow voids seen at the skull base.  Extensive susceptibility artifact about the oral cavity. Visualized paranasal sinuses and mastoid air cells are well-aerated. No suspicious calvarial bone marrow signal. No abnormal sellar expansion. Craniocervical junction maintained.  MRI ORBITS FINDINGS  Limited evaluation due to artifact about the oral cavity likely represent braces or dental amalgam.  RIGHT T2 signal and enhancement of the RIGHT optic nerve (posterior intraorbital segment, intracanicular and, proximal cisternal segment). Faint enhancement of the RIGHT retina. No orbital mass. Optic nerve sleeves are normal. Ocular globes intact, lenses are located. Preservation orbital fat. Normal appearance the extraocular muscles. Superior ophthalmic veins are not enlarged.  IMPRESSION: MRI HEAD: Normal MRI of the brain with and without contrast.   MRI ORBITS: Limited evaluation due to artifact about the oral cavity.  Enhancing RIGHT optic nerve compatible with optic neuritis.   Electronically Signed   By: Awilda Metro M.D.   On: 02/18/2015 01:50    MEDICATIONS                                                                                                                        Scheduled: . enoxaparin (LOVENOX) injection  40 mg Subcutaneous Q24H  . insulin aspart  0-15 Units Subcutaneous TID WC  . insulin aspart  0-5 Units Subcutaneous QHS  . methylPREDNISolone (SOLU-MEDROL) injection  1,000 mg Intravenous Daily  . 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  No demyelinating lesions noted on MRI brain with and without contrast. Had an ample conversation with patient regarding the diagnosis, treatment, prognosis of this entity. I explained to her that the purpose of treatment with high dose IV steroid is essentially hasten the resolution of her visual disturbances. Similarly, I reviewed with her the literature regarding the potential risk for developing clinically definitive MS over the next 5 years, and informed her that ON with MRI brain free of demyelinating lesions still confers a 25% probability of MS and that CSF analysis may not be neccessary at this time.  Will check some serologies, although the yield of abnormal serology in  typical ON usually low. IV solumedrol 1 gram daily x 3 days. Follow up with neurology in 2-3 weeks.  Wyatt Portela, MD [facility] [phone]  02/18/2015, 8:28 AM

## 2015-02-18 NOTE — Progress Notes (Signed)
[facility] PROGRESS NOTE   Hannah Dickson ZOX:[id number] DOB: [birth year] DOA: 02/17/2015 PCP: Pcp Not In System  HPI/Subjective: Patient speaks little English, denies any new complaints. Right eye visual changes improving since yesterday, no headaches.  Assessment/Plan: Principal Problem:   Visual loss Active Problems:   Diabetes mellitus, type 2    Right optic neuritis Patient presented to the hospital with right eye visual changes and headaches. No evidence of temporal arteritis, no tender temple. Headaches resolved. MRI of the brain was obtained and showed right optic nerve neuritis. Seen by neurology and recommended Solu-Medrol 1 g for 3 days.  Diabetes mellitus type 2 Will check hemoglobin A1c. Patient on huge doses of Solu-Medrol, this will increase blood glucose especially postprandial CBGs. Added sliding scale of insulin.  Code Status: Full Code Family Communication: Plan discussed with the patient. Disposition Plan: Remains inpatient Diet: Diet Carb Modified Fluid consistency:: Thin; Room service appropriate?: Yes  Consultants:  Neurology  Procedures:  None  Antibiotics:  None   Objective: Filed Vitals:   March 02, 2015 0608  BP: 110/76  Pulse: 57  Temp: 98 F (36.7 C)  Resp: 18    Intake/Output Summary (Last 24 hours) at 03-02-2015 1020 Last data filed at 02/17/15 2000  Gross per 24 hour  Intake    360 ml  Output      0 ml  Net    360 ml   Filed Weights   02/17/15 1508  Weight: 66.8 kg (147 lb 4.3 oz)    Exam: General: Alert and awake, oriented x3, not in any acute distress. HEENT: anicteric sclera, pupils reactive to light and accommodation, EOMI CVS: S1-S2 clear, no murmur rubs or gallops Chest: clear to auscultation bilaterally, no wheezing, rales or rhonchi Abdomen: soft nontender, nondistended, normal bowel sounds, no organomegaly Extremities: no cyanosis, clubbing or edema noted bilaterally Neuro: Cranial nerves  II-XII intact, no focal neurological deficits  Data Reviewed: Basic Metabolic Panel:  Recent Labs Lab 02/17/15 1901 Mar 02, 2015 0403  NA 136 134*  K 4.0 4.6  CL 105 103  CO2 24 23  GLUCOSE 117* 171*  BUN 10 11  CREATININE 0.59 0.81  CALCIUM 8.7* 8.8*   Liver Function Tests:  Recent Labs Lab 02/17/15 1901  AST 33  ALT 37  ALKPHOS 86  BILITOT 0.4  PROT 7.0  ALBUMIN 4.0   No results for input(s): LIPASE, AMYLASE in the last 168 hours. No results for input(s): AMMONIA in the last 168 hours. CBC:  Recent Labs Lab 02/17/15 1901 2015-03-02 0403  WBC 2.5* 2.7*  HGB 10.9* 11.6*  HCT 32.7* 34.5*  MCV 91.1 92.0  PLT 139* 129*   Cardiac Enzymes: No results for input(s): CKTOTAL, CKMB, CKMBINDEX, TROPONINI in the last 168 hours. BNP (last 3 results) No results for input(s): BNP in the last 8760 hours.  ProBNP (last 3 results) No results for input(s): PROBNP in the last 8760 hours.  CBG:  Recent Labs Lab 02/17/15 2135 2015-03-02 0640  GLUCAP 125* 144*    Micro No results found for this or any previous visit (from the past 240 hour(s)).   Studies: Mr Lodema Pilot Contrast  March 02, 2015   CLINICAL DATA:  Ten day history of RIGHT-sided blurry vision and ocular pain, improving over last several days. Fatigue. History of diabetes.  EXAM: MRI HEAD AND ORBITS WITHOUT AND WITH CONTRAST  TECHNIQUE: Multiplanar, multiecho pulse sequences of the brain and surrounding structures were obtained without and with intravenous contrast. Multiplanar, multiecho pulse sequences of the  orbits and surrounding structures were obtained including fat saturation techniques, before and after intravenous contrast administration.  CONTRAST:  10mL MULTIHANCE GADOBENATE DIMEGLUMINE 529 MG/ML IV SOLN  COMPARISON:  None.  FINDINGS: MRI HEAD FINDINGS  The ventricles and sulci are normal for patient's age. No abnormal parenchymal signal, mass lesions, mass effect. No abnormal parenchymal enhancement. No reduced  diffusion to suggest acute ischemia. No susceptibility artifact to suggest hemorrhage.  No abnormal extra-axial fluid collections. No extra-axial masses nor leptomeningeal enhancement. Normal major intracranial vascular flow voids seen at the skull base.  Extensive susceptibility artifact about the oral cavity. Visualized paranasal sinuses and mastoid air cells are well-aerated. No suspicious calvarial bone marrow signal. No abnormal sellar expansion. Craniocervical junction maintained.  MRI ORBITS FINDINGS  Limited evaluation due to artifact about the oral cavity likely represent braces or dental amalgam.  RIGHT T2 signal and enhancement of the RIGHT optic nerve (posterior intraorbital segment, intracanicular and, proximal cisternal segment). Faint enhancement of the RIGHT retina. No orbital mass. Optic nerve sleeves are normal. Ocular globes intact, lenses are located. Preservation orbital fat. Normal appearance the extraocular muscles. Superior ophthalmic veins are not enlarged.  IMPRESSION: MRI HEAD: Normal MRI of the brain with and without contrast.  MRI ORBITS: Limited evaluation due to artifact about the oral cavity.  Enhancing RIGHT optic nerve compatible with optic neuritis.   Electronically Signed   By: Awilda Metro M.D.   On: 02/18/2015 01:50   Mr Birdie Hopes Wo/w Cm  02/18/2015   CLINICAL DATA:  Ten day history of RIGHT-sided blurry vision and ocular pain, improving over last several days. Fatigue. History of diabetes.  EXAM: MRI HEAD AND ORBITS WITHOUT AND WITH CONTRAST  TECHNIQUE: Multiplanar, multiecho pulse sequences of the brain and surrounding structures were obtained without and with intravenous contrast. Multiplanar, multiecho pulse sequences of the orbits and surrounding structures were obtained including fat saturation techniques, before and after intravenous contrast administration.  CONTRAST:  10mL MULTIHANCE GADOBENATE DIMEGLUMINE 529 MG/ML IV SOLN  COMPARISON:  None.  FINDINGS: MRI HEAD  FINDINGS  The ventricles and sulci are normal for patient's age. No abnormal parenchymal signal, mass lesions, mass effect. No abnormal parenchymal enhancement. No reduced diffusion to suggest acute ischemia. No susceptibility artifact to suggest hemorrhage.  No abnormal extra-axial fluid collections. No extra-axial masses nor leptomeningeal enhancement. Normal major intracranial vascular flow voids seen at the skull base.  Extensive susceptibility artifact about the oral cavity. Visualized paranasal sinuses and mastoid air cells are well-aerated. No suspicious calvarial bone marrow signal. No abnormal sellar expansion. Craniocervical junction maintained.  MRI ORBITS FINDINGS  Limited evaluation due to artifact about the oral cavity likely represent braces or dental amalgam.  RIGHT T2 signal and enhancement of the RIGHT optic nerve (posterior intraorbital segment, intracanicular and, proximal cisternal segment). Faint enhancement of the RIGHT retina. No orbital mass. Optic nerve sleeves are normal. Ocular globes intact, lenses are located. Preservation orbital fat. Normal appearance the extraocular muscles. Superior ophthalmic veins are not enlarged.  IMPRESSION: MRI HEAD: Normal MRI of the brain with and without contrast.  MRI ORBITS: Limited evaluation due to artifact about the oral cavity.  Enhancing RIGHT optic nerve compatible with optic neuritis.   Electronically Signed   By: Awilda Metro M.D.   On: 02/18/2015 01:50    Scheduled Meds: . enoxaparin (LOVENOX) injection  40 mg Subcutaneous Q24H  . insulin aspart  0-15 Units Subcutaneous TID WC  . insulin aspart  0-5 Units Subcutaneous QHS  . methylPREDNISolone (SOLU-MEDROL)  injection  1,000 mg Intravenous Daily  . sodium chloride  3 mL Intravenous Q12H   Continuous Infusions:      Time spent: 35 minutes    [facility] A  [facility] Pager [phone] If 7PM-7AM, please contact night-coverage at [email or web address], password  [facility] 02/18/2015, 10:20 AM  LOS: 1 day

## 2015-02-19 LAB — ANGIOTENSIN CONVERTING ENZYME: ANGIOTENSIN-CONVERTING ENZYME: 43 U/L (ref 14–82)

## 2015-02-19 LAB — GLUCOSE, CAPILLARY
GLUCOSE-CAPILLARY: 263 mg/dL — AB (ref 65–99)
Glucose-Capillary: 277 mg/dL — ABNORMAL HIGH (ref 65–99)
Glucose-Capillary: 287 mg/dL — ABNORMAL HIGH (ref 65–99)
Glucose-Capillary: 365 mg/dL — ABNORMAL HIGH (ref 65–99)

## 2015-02-19 LAB — RHEUMATOID FACTOR

## 2015-02-19 NOTE — Progress Notes (Signed)
[facility] PROGRESS NOTE   Hannah Dickson NWG:[id number] DOB: 07/03/1965 DOA: 02/17/2015 PCP: Pcp Not In System  HPI/Subjective: Patient speaks little English, seen with her daughter who speaks English at bedside, denies any new complaints. No headaches.  Assessment/Plan: Principal Problem:   Visual loss Active Problems:   Diabetes mellitus, type 2   Optic neuritis    Right optic neuritis Patient presented to the hospital with right eye visual changes and headaches. No evidence of temporal arteritis, no tender temple. Headaches resolved. MRI of the brain was obtained and showed right optic nerve neuritis. Seen by neurology and recommended Solu-Medrol 1 g for 3 days. Today's date to of Solu-Medrol, likely can be discharged home.  Diabetes mellitus type 2 Hemoglobin A1c pending. Patient on huge doses of Solu-Medrol, this will increase blood glucose especially postprandial CBGs. Added sliding scale of insulin.   Code Status: Full Code Family Communication: Plan discussed with the patient. Disposition Plan: Remains inpatient Diet: Diet Carb Modified Fluid consistency:: Thin; Room service appropriate?: Yes  Consultants:  Neurology  Procedures:  None  Antibiotics:  None   Objective: Filed Vitals:   02/19/15 0959  BP: 101/58  Pulse:   Temp: 98.1 F (36.7 C)  Resp: 18    Intake/Output Summary (Last 24 hours) at 02/19/15 1022 Last data filed at 02/26/15 2206  Gross per 24 hour  Intake    240 ml  Output      0 ml  Net    240 ml   Filed Weights   02/17/15 1508  Weight: 66.8 kg (147 lb 4.3 oz)    Exam: General: Alert and awake, oriented x3, not in any acute distress. HEENT: anicteric sclera, pupils reactive to light and accommodation, EOMI CVS: S1-S2 clear, no murmur rubs or gallops Chest: clear to auscultation bilaterally, no wheezing, rales or rhonchi Abdomen: soft nontender, nondistended, normal bowel sounds, no  organomegaly Extremities: no cyanosis, clubbing or edema noted bilaterally Neuro: Cranial nerves II-XII intact, no focal neurological deficits  Data Reviewed: Basic Metabolic Panel:  Recent Labs Lab 02/17/15 1901 2015-02-26 0403  NA 136 134*  K 4.0 4.6  CL 105 103  CO2 24 23  GLUCOSE 117* 171*  BUN 10 11  CREATININE 0.59 0.81  CALCIUM 8.7* 8.8*   Liver Function Tests:  Recent Labs Lab 02/17/15 1901  AST 33  ALT 37  ALKPHOS 86  BILITOT 0.4  PROT 7.0  ALBUMIN 4.0   No results for input(s): LIPASE, AMYLASE in the last 168 hours. No results for input(s): AMMONIA in the last 168 hours. CBC:  Recent Labs Lab 02/17/15 1901 2015/02/26 0403  WBC 2.5* 2.7*  HGB 10.9* 11.6*  HCT 32.7* 34.5*  MCV 91.1 92.0  PLT 139* 129*   Cardiac Enzymes: No results for input(s): CKTOTAL, CKMB, CKMBINDEX, TROPONINI in the last 168 hours. BNP (last 3 results) No results for input(s): BNP in the last 8760 hours.  ProBNP (last 3 results) No results for input(s): PROBNP in the last 8760 hours.  CBG:  Recent Labs Lab Feb 26, 2015 0640 Feb 26, 2015 1147 02-26-15 1700 26-Feb-2015 2143 02/19/15 0637  GLUCAP 144* 231* 320* 308* 263*    Micro No results found for this or any previous visit (from the past 240 hour(s)).   Studies: Mr Lodema Pilot Contrast  2015/02/26   CLINICAL DATA:  Ten day history of RIGHT-sided blurry vision and ocular pain, improving over last several days. Fatigue. History of diabetes.  EXAM: MRI HEAD AND ORBITS WITHOUT AND WITH CONTRAST  TECHNIQUE: Multiplanar, multiecho pulse sequences of the brain and surrounding structures were obtained without and with intravenous contrast. Multiplanar, multiecho pulse sequences of the orbits and surrounding structures were obtained including fat saturation techniques, before and after intravenous contrast administration.  CONTRAST:  10mL MULTIHANCE GADOBENATE DIMEGLUMINE 529 MG/ML IV SOLN  COMPARISON:  None.  FINDINGS: MRI HEAD FINDINGS  The  ventricles and sulci are normal for patient's age. No abnormal parenchymal signal, mass lesions, mass effect. No abnormal parenchymal enhancement. No reduced diffusion to suggest acute ischemia. No susceptibility artifact to suggest hemorrhage.  No abnormal extra-axial fluid collections. No extra-axial masses nor leptomeningeal enhancement. Normal major intracranial vascular flow voids seen at the skull base.  Extensive susceptibility artifact about the oral cavity. Visualized paranasal sinuses and mastoid air cells are well-aerated. No suspicious calvarial bone marrow signal. No abnormal sellar expansion. Craniocervical junction maintained.  MRI ORBITS FINDINGS  Limited evaluation due to artifact about the oral cavity likely represent braces or dental amalgam.  RIGHT T2 signal and enhancement of the RIGHT optic nerve (posterior intraorbital segment, intracanicular and, proximal cisternal segment). Faint enhancement of the RIGHT retina. No orbital mass. Optic nerve sleeves are normal. Ocular globes intact, lenses are located. Preservation orbital fat. Normal appearance the extraocular muscles. Superior ophthalmic veins are not enlarged.  IMPRESSION: MRI HEAD: Normal MRI of the brain with and without contrast.  MRI ORBITS: Limited evaluation due to artifact about the oral cavity.  Enhancing RIGHT optic nerve compatible with optic neuritis.   Electronically Signed   By: Awilda Metro M.D.   On: 02/18/2015 01:50   Mr Birdie Hopes Wo/w Cm  02/18/2015   CLINICAL DATA:  Ten day history of RIGHT-sided blurry vision and ocular pain, improving over last several days. Fatigue. History of diabetes.  EXAM: MRI HEAD AND ORBITS WITHOUT AND WITH CONTRAST  TECHNIQUE: Multiplanar, multiecho pulse sequences of the brain and surrounding structures were obtained without and with intravenous contrast. Multiplanar, multiecho pulse sequences of the orbits and surrounding structures were obtained including fat saturation techniques, before  and after intravenous contrast administration.  CONTRAST:  10mL MULTIHANCE GADOBENATE DIMEGLUMINE 529 MG/ML IV SOLN  COMPARISON:  None.  FINDINGS: MRI HEAD FINDINGS  The ventricles and sulci are normal for patient's age. No abnormal parenchymal signal, mass lesions, mass effect. No abnormal parenchymal enhancement. No reduced diffusion to suggest acute ischemia. No susceptibility artifact to suggest hemorrhage.  No abnormal extra-axial fluid collections. No extra-axial masses nor leptomeningeal enhancement. Normal major intracranial vascular flow voids seen at the skull base.  Extensive susceptibility artifact about the oral cavity. Visualized paranasal sinuses and mastoid air cells are well-aerated. No suspicious calvarial bone marrow signal. No abnormal sellar expansion. Craniocervical junction maintained.  MRI ORBITS FINDINGS  Limited evaluation due to artifact about the oral cavity likely represent braces or dental amalgam.  RIGHT T2 signal and enhancement of the RIGHT optic nerve (posterior intraorbital segment, intracanicular and, proximal cisternal segment). Faint enhancement of the RIGHT retina. No orbital mass. Optic nerve sleeves are normal. Ocular globes intact, lenses are located. Preservation orbital fat. Normal appearance the extraocular muscles. Superior ophthalmic veins are not enlarged.  IMPRESSION: MRI HEAD: Normal MRI of the brain with and without contrast.  MRI ORBITS: Limited evaluation due to artifact about the oral cavity.  Enhancing RIGHT optic nerve compatible with optic neuritis.   Electronically Signed   By: Awilda Metro M.D.   On: 02/18/2015 01:50    Scheduled Meds: . enoxaparin (LOVENOX) injection  40 mg Subcutaneous  Q24H  . insulin aspart  0-15 Units Subcutaneous TID WC  . insulin aspart  0-5 Units Subcutaneous QHS  . methylPREDNISolone (SOLU-MEDROL) injection  1,000 mg Intravenous Daily  . sodium chloride  3 mL Intravenous Q12H   Continuous Infusions:      Time  spent: 35 minutes    [facility] A  [facility] Pager [phone] If 7PM-7AM, please contact night-coverage at [email or web address], password [facility] 02/19/2015, 10:22 AM  LOS: 2 days

## 2015-02-19 NOTE — Progress Notes (Signed)
Utilization Review Completed.Hannah Dickson T9/09/2014  

## 2015-02-20 LAB — GLUCOSE, CAPILLARY
GLUCOSE-CAPILLARY: 264 mg/dL — AB (ref 65–99)
GLUCOSE-CAPILLARY: 144 mg/dL — AB (ref 65–99)

## 2015-02-20 NOTE — Progress Notes (Signed)
Discharge orders received, Pt for discharge home today. IV d/c'd. D/c instructions given with verbalized understanding. Family at bedside to assist patient with discharge. Staff bought pt downstairs via wheelchair.

## 2015-02-20 NOTE — Discharge Summary (Signed)
Physician Discharge Summary  Hannah Dickson JXB:[id number] DOB: 1965/11/05 DOA: 02/17/2015  PCP: Pcp Not In System  Admit date: 02/17/2015 Discharge date: 02/20/2015  Time spent: 40 minutes  Recommendations for Outpatient Follow-up:  1. Follow-up with [facility] in 2 weeks.  Discharge Diagnoses:  Principal Problem:   Visual loss Active Problems:   Diabetes mellitus, type 2   Optic neuritis   Discharge Condition: Stable  Diet recommendation: Carbohydrates modified diet  Filed Weights   02/17/15 1508  Weight: 66.8 kg (147 lb 4.3 oz)    History of present illness:  Hannah Dickson is a 49 y.o. female with a past medical history of diabetes mellitus who presents as a direct admit from Dr. Allyne Gee office for further evaluation of right eye visual loss. Patient reporting a 10 day history of right-sided blurry vision associated with ocular pain. She states that symptoms have actually improved over the past several days. She also complains of associated fatigue particularly at the end of the day. She denies numbness, tingling, muscle weakness, spasticity, gait abnormalities, urinary symptoms. There was concern that symptoms could be secondary to an mass for which Dr Allyne Gee requested MRI of brain and neurology consultation. She denies having prior episodes of visual loss or blurriness.  Hospital Course:    Right optic neuritis Patient presented to the hospital with right eye visual changes and headaches. No evidence of temporal arteritis, no tender temple. Headaches resolved. MRI of the brain was obtained and showed right optic nerve neuritis. Seen by neurology and recommended Solu-Medrol 1 g for 3 days. No steroids taper on discharge, follow-up with neurology in 2 weeks  Diabetes mellitus type 2 Hemoglobin A1c pending. Because of the high-dose of Solu-Medrol CBGs increased especially postprandial  CBGs.   Procedures:  None  Consultations:  Neuro  Discharge Exam: Filed Vitals:   02/20/15 0941  BP: 99/69  Pulse: 64  Temp: 98 F (36.7 C)  Resp: 16   General: Alert and awake, oriented x3, not in any acute distress. HEENT: anicteric sclera, pupils reactive to light and accommodation, EOMI CVS: S1-S2 clear, no murmur rubs or gallops Chest: clear to auscultation bilaterally, no wheezing, rales or rhonchi Abdomen: soft nontender, nondistended, normal bowel sounds, no organomegaly Extremities: no cyanosis, clubbing or edema noted bilaterally Neuro: Cranial nerves II-XII intact, no focal neurological deficits  Discharge Instructions   Discharge Instructions    Diet Carb Modified    Complete by:  As directed      Increase activity slowly    Complete by:  As directed           Current Discharge Medication List    CONTINUE these medications which have NOT CHANGED   Details  ibuprofen (ADVIL,MOTRIN) 800 MG tablet Take 800 mg by mouth daily as needed for moderate pain.     lisinopril (PRINIVIL,ZESTRIL) 2.5 MG tablet Take 2.5 mg by mouth at bedtime.     metFORMIN (GLUCOPHAGE) 1000 MG tablet Take 1,000 mg by mouth 2 (two) times daily with a meal.       No Known Allergies Follow-up Information    Follow up with [facility]. Schedule an appointment as soon as possible for a visit in 2 weeks.   Contact information:   [address] [place] [address] [phone]       The results of significant diagnostics from this hospitalization (including imaging, microbiology, ancillary and laboratory) are listed below for reference.    Significant Diagnostic Studies: Hannah Hannah Dickson  Wo Contrast  02/18/2015   CLINICAL DATA:  Ten day history of RIGHT-sided blurry vision and ocular pain, improving over last several days. Fatigue. History of diabetes.  EXAM: MRI HEAD AND ORBITS WITHOUT AND WITH CONTRAST  TECHNIQUE: Multiplanar, multiecho pulse sequences  of the brain and surrounding structures were obtained without and with intravenous contrast. Multiplanar, multiecho pulse sequences of the orbits and surrounding structures were obtained including fat saturation techniques, before and after intravenous contrast administration.  CONTRAST:  10mL MULTIHANCE GADOBENATE DIMEGLUMINE 529 MG/ML IV SOLN  COMPARISON:  None.  FINDINGS: MRI HEAD FINDINGS  The ventricles and sulci are normal for patient's age. No abnormal parenchymal signal, mass lesions, mass effect. No abnormal parenchymal enhancement. No reduced diffusion to suggest acute ischemia. No susceptibility artifact to suggest hemorrhage.  No abnormal extra-axial fluid collections. No extra-axial masses nor leptomeningeal enhancement. Normal major intracranial vascular flow voids seen at the skull base.  Extensive susceptibility artifact about the oral cavity. Visualized paranasal sinuses and mastoid air cells are well-aerated. No suspicious calvarial bone marrow signal. No abnormal sellar expansion. Craniocervical junction maintained.  MRI ORBITS FINDINGS  Limited evaluation due to artifact about the oral cavity likely represent braces or dental amalgam.  RIGHT T2 signal and enhancement of the RIGHT optic nerve (posterior intraorbital segment, intracanicular and, proximal cisternal segment). Faint enhancement of the RIGHT retina. No orbital mass. Optic nerve sleeves are normal. Ocular globes intact, lenses are located. Preservation orbital fat. Normal appearance the extraocular muscles. Superior ophthalmic veins are not enlarged.  IMPRESSION: MRI HEAD: Normal MRI of the brain with and without contrast.  MRI ORBITS: Limited evaluation due to artifact about the oral cavity.  Enhancing RIGHT optic nerve compatible with optic neuritis.   Electronically Signed   By: Awilda Metro M.D.   On: 02/18/2015 01:50   Hannah Dickson Wo/w Cm  02/18/2015   CLINICAL DATA:  Ten day history of RIGHT-sided blurry vision and ocular  pain, improving over last several days. Fatigue. History of diabetes.  EXAM: MRI HEAD AND ORBITS WITHOUT AND WITH CONTRAST  TECHNIQUE: Multiplanar, multiecho pulse sequences of the brain and surrounding structures were obtained without and with intravenous contrast. Multiplanar, multiecho pulse sequences of the orbits and surrounding structures were obtained including fat saturation techniques, before and after intravenous contrast administration.  CONTRAST:  10mL MULTIHANCE GADOBENATE DIMEGLUMINE 529 MG/ML IV SOLN  COMPARISON:  None.  FINDINGS: MRI HEAD FINDINGS  The ventricles and sulci are normal for patient's age. No abnormal parenchymal signal, mass lesions, mass effect. No abnormal parenchymal enhancement. No reduced diffusion to suggest acute ischemia. No susceptibility artifact to suggest hemorrhage.  No abnormal extra-axial fluid collections. No extra-axial masses nor leptomeningeal enhancement. Normal major intracranial vascular flow voids seen at the skull base.  Extensive susceptibility artifact about the oral cavity. Visualized paranasal sinuses and mastoid air cells are well-aerated. No suspicious calvarial bone marrow signal. No abnormal sellar expansion. Craniocervical junction maintained.  MRI ORBITS FINDINGS  Limited evaluation due to artifact about the oral cavity likely represent braces or dental amalgam.  RIGHT T2 signal and enhancement of the RIGHT optic nerve (posterior intraorbital segment, intracanicular and, proximal cisternal segment). Faint enhancement of the RIGHT retina. No orbital mass. Optic nerve sleeves are normal. Ocular globes intact, lenses are located. Preservation orbital fat. Normal appearance the extraocular muscles. Superior ophthalmic veins are not enlarged.  IMPRESSION: MRI HEAD: Normal MRI of the brain with and without contrast.  MRI ORBITS: Limited evaluation due to artifact about the oral  cavity.  Enhancing RIGHT optic nerve compatible with optic neuritis.    Electronically Signed   By: Awilda Metro M.D.   On: 02/18/2015 01:50    Microbiology: No results found for this or any previous visit (from the past 240 hour(s)).   Labs: Basic Metabolic Panel:  Recent Labs Lab 02/17/15 1901 02/18/15 0403  NA 136 134*  K 4.0 4.6  CL 105 103  CO2 24 23  GLUCOSE 117* 171*  BUN 10 11  CREATININE 0.59 0.81  CALCIUM 8.7* 8.8*   Liver Function Tests:  Recent Labs Lab 02/17/15 1901  AST 33  ALT 37  ALKPHOS 86  BILITOT 0.4  PROT 7.0  ALBUMIN 4.0   No results for input(s): LIPASE, AMYLASE in the last 168 hours. No results for input(s): AMMONIA in the last 168 hours. CBC:  Recent Labs Lab 02/17/15 1901 02/18/15 0403  WBC 2.5* 2.7*  HGB 10.9* 11.6*  HCT 32.7* 34.5*  MCV 91.1 92.0  PLT 139* 129*   Cardiac Enzymes: No results for input(s): CKTOTAL, CKMB, CKMBINDEX, TROPONINI in the last 168 hours. BNP: BNP (last 3 results) No results for input(s): BNP in the last 8760 hours.  ProBNP (last 3 results) No results for input(s): PROBNP in the last 8760 hours.  CBG:  Recent Labs Lab 02/19/15 0637 02/19/15 1131 02/19/15 1640 02/19/15 2114 02/20/15 0629  GLUCAP 263* 277* 287* 365* 264*       Signed:  Philo Kurtz A  [facility] 02/20/2015, 10:29 AM

## 2015-02-21 LAB — LYME, WESTERN BLOT, SERUM (REFLEXED)
IGG P23 AB.: ABSENT
IGG P28 AB.: ABSENT
IGG P39 AB.: ABSENT
IGG P41 AB.: ABSENT
IGG P93 AB.: ABSENT
IgG P18 Ab.: ABSENT
IgG P30 Ab.: ABSENT
IgG P45 Ab.: ABSENT
IgG P58 Ab.: ABSENT
IgG P66 Ab.: ABSENT
IgM P23 Ab.: ABSENT
IgM P39 Ab.: ABSENT
LYME IGG WB: NEGATIVE
Lyme IgM Wb: NEGATIVE

## 2015-02-21 LAB — HEMOGLOBIN A1C
Hgb A1c MFr Bld: 5.9 % — ABNORMAL HIGH (ref 4.8–5.6)
MEAN PLASMA GLUCOSE: 123 mg/dL

## 2015-02-21 LAB — B. BURGDORFI ANTIBODIES: B burgdorferi Ab IgG+IgM: 0.92 {ISR} — ABNORMAL HIGH (ref 0.00–0.90)

## 2015-02-22 LAB — ANTIPHOSPHOLIPID SYNDROME EVAL, BLD
Anticardiolipin IgA: <9 APL U/mL (ref 0–11)
Anticardiolipin IgG: <9 GPL U/mL (ref 0–14)
Anticardiolipin IgM: <9 MPL U/mL (ref 0–12)
DRVVT: 42.4 s (ref 0.0–55.1)
PHOSPHATYDALSERINE, IGA: 2 {APS'U} (ref 0–20)
PHOSPHATYDALSERINE, IGG: 5 {GPS'U} (ref 0–11)
PHOSPHATYDALSERINE, IGM: 9 {MPS'U} (ref 0–25)
PTT LA: 43.6 s (ref 0.0–50.0)

## 2015-03-09 ENCOUNTER — Other Ambulatory Visit (HOSPITAL_COMMUNITY): Payer: Self-pay | Admitting: *Deleted

## 2015-03-09 DIAGNOSIS — Z1231 Encounter for screening mammogram for malignant neoplasm of breast: Secondary | ICD-10-CM

## 2015-03-16 ENCOUNTER — Ambulatory Visit (HOSPITAL_COMMUNITY)
Admission: RE | Admit: 2015-03-16 | Discharge: 2015-03-16 | Disposition: A | Discharge status: Home / Self Care | Payer: PRIVATE HEALTH INSURANCE | Source: Ambulatory Visit | Attending: *Deleted | Admitting: *Deleted

## 2015-03-16 DIAGNOSIS — Z1231 Encounter for screening mammogram for malignant neoplasm of breast: Secondary | ICD-10-CM

## 2015-04-05 ENCOUNTER — Ambulatory Visit: Payer: PRIVATE HEALTH INSURANCE | Admitting: Neurology

## 2015-12-22 ENCOUNTER — Encounter (HOSPITAL_COMMUNITY): Payer: Self-pay | Admitting: Hematology & Oncology

## 2015-12-22 ENCOUNTER — Encounter (HOSPITAL_COMMUNITY): Payer: Self-pay

## 2015-12-22 ENCOUNTER — Encounter (HOSPITAL_COMMUNITY): Payer: Self-pay | Attending: Hematology & Oncology | Admitting: Hematology & Oncology

## 2015-12-22 VITALS — BP 133/86 | HR 106 | Temp 100.1°F | Resp 20 | Ht <= 58 in | Wt 135.5 lb

## 2015-12-22 DIAGNOSIS — Z9889 Other specified postprocedural states: Secondary | ICD-10-CM | POA: Insufficient documentation

## 2015-12-22 DIAGNOSIS — D589 Hereditary hemolytic anemia, unspecified: Secondary | ICD-10-CM | POA: Insufficient documentation

## 2015-12-22 DIAGNOSIS — H469 Unspecified optic neuritis: Secondary | ICD-10-CM

## 2015-12-22 DIAGNOSIS — D72819 Decreased white blood cell count, unspecified: Secondary | ICD-10-CM

## 2015-12-22 DIAGNOSIS — D591 Autoimmune hemolytic anemia, unspecified: Secondary | ICD-10-CM

## 2015-12-22 DIAGNOSIS — E119 Type 2 diabetes mellitus without complications: Secondary | ICD-10-CM | POA: Insufficient documentation

## 2015-12-22 DIAGNOSIS — Z79899 Other long term (current) drug therapy: Secondary | ICD-10-CM | POA: Insufficient documentation

## 2015-12-22 DIAGNOSIS — D708 Other neutropenia: Secondary | ICD-10-CM

## 2015-12-22 LAB — COMPREHENSIVE METABOLIC PANEL WITH GFR
ALT: 29 U/L (ref 14–54)
AST: 22 U/L (ref 15–41)
Albumin: 4.2 g/dL (ref 3.5–5.0)
Alkaline Phosphatase: 99 U/L (ref 38–126)
Anion gap: 7 (ref 5–15)
BUN: 12 mg/dL (ref 6–20)
CO2: 25 mmol/L (ref 22–32)
Calcium: 8.5 mg/dL — ABNORMAL LOW (ref 8.9–10.3)
Chloride: 100 mmol/L — ABNORMAL LOW (ref 101–111)
Creatinine, Ser: 0.42 mg/dL — ABNORMAL LOW (ref 0.44–1.00)
GFR calc Af Amer: >60 mL/min (ref >60)
GFR calc non Af Amer: >60 mL/min (ref >60)
Glucose, Bld: 298 mg/dL — ABNORMAL HIGH (ref 65–99)
Potassium: 3.8 mmol/L (ref 3.5–5.1)
Sodium: 132 mmol/L — ABNORMAL LOW (ref 135–145)
Total Bilirubin: 1.2 mg/dL (ref 0.3–1.2)
Total Protein: 7 g/dL (ref 6.5–8.1)

## 2015-12-22 LAB — RETICULOCYTES
RBC.: 3.46 MIL/uL — ABNORMAL LOW (ref 3.87–5.11)
Retic Count, Absolute: 103.8 K/uL (ref 19.0–186.0)
Retic Ct Pct: 3 % (ref 0.4–3.1)

## 2015-12-22 LAB — FERRITIN
FERRITIN: 248 ng/mL (ref 11–307)
Ferritin: 265 ng/mL (ref 11–307)

## 2015-12-22 LAB — FOLATE: Folate: 51.9 ng/mL (ref >5.9)

## 2015-12-22 LAB — LACTATE DEHYDROGENASE: LDH: 262 U/L — AB (ref 98–192)

## 2015-12-22 LAB — SEDIMENTATION RATE: Sed Rate: 58 mm/hr — ABNORMAL HIGH (ref 0–22)

## 2015-12-22 LAB — VITAMIN B12: VITAMIN B 12: 2322 pg/mL — AB (ref 180–914)

## 2015-12-22 NOTE — Progress Notes (Signed)
Medical interpreter present for review of discharge instructions and after visit summary. Follow-up and lab work discussed with patient per nursing staff. Medical interpreter present to translate education.

## 2015-12-22 NOTE — Progress Notes (Signed)
[name]  CONSULT NOTE  Patient Care Team: Verdell Face. Muse, PA-C as PCP - General  CHIEF COMPLAINTS/PURPOSE OF CONSULTATION:  Transfer of care from [facility] for history of Hemolytic Anemia  HISTORY OF PRESENTING ILLNESS:  Hannah Dickson 50 y.o. female is here because of a history of hemolytic anemia. She comes to the [facility] today for consult  She says that she is good today. A translator is here to facilitate the encounter.  From available records she was seen twice at [facility] first on 09/07/2015 and next on 11/09/2015. Notes are detailed below:      Per the patient: The patient has diabetes; went to the clinic for a routine check. They said she had to go back and re-do it because something was bad. She didn't go back to the same place; she went to see another doctor in [place]. She comments on doing bloodwork to see what the problem was; she was told to go to the ER because she was in a "state of alert." Her blood glucose was low. She went to the ER an was referred to the doctor in [place], who has been taking care of her. She confirms that she went to the ER in [place].   She says the last time she went to the doctor, they told her that her body makes something bad that kills the good. She notes "that's why they gave me two bags of blood in the ER." She confirms being on the prednisone, but says that she was only on it for one month, and that Dr. Ubaldo Glassing stopped the prednisone. She notes that he wanted to see what the reaction would be.  She notes that she went on a Monday and had blood drawn, but doesn't know the results. She notes that the lady said that "if I call you, it's bad news; no news, good news."  The patient wonders why she's lost so much weight in a month's time. She notes that she didn't gain weight while on the prednisone; she didn't even eat more than usual, even though the doctor said she would eat more. When asked if she felt any fluid or  swelling in her legs, she says no. She notes that the only thing that really worried her was, while taking the medication, she stopped seeing for 3 weeks. She adds that her sugar went up to 240.She notes that once she stopped taking it, it went back to normal. Her sugar also went down, to 190 now.  When asked about her appetite, she says she doesn't know. She notes that, for this past week, she hasn't felt hungry. She says she feels hungry but she doesn't want to eat. She doesn't even want water. She notes that she's felt nausea once but not today. She says when she walks, she feels a little shortness of breath. She confirms that she gets mammograms every year.  Before she was told she had problems with her blood, did she have problems? She says yes, many; she notes that, in October, she went to the hospital in [place] because she was about to have a stroke. She says that in her right eye, she couldn't see; so she was referred to an eye doctor. The eye doctor sent her to the ER for an MRI. She went to the hospital in [place]. She was told that there was an obstruction of a nerve; inflamed, swollen; that she kept having dizziness and vomiting at this time. The patient  notes that they told her she was anemic in March/April, but she didn't know anything about that before then. She had the MRI in September. When asked if she felt good during Christmas, she says sometimes yes, sometimes no.  She notes that she feels very good because nothing hurts; "I used to have headaches." She notes "I'm not taking any pills." She says "it doesn't hurt me, so I feel very good."  The patient wonders "my anemia; is it bad?" She was advised that, a couple of weeks ago, it was. When told that her last blood count was above 12, the patient gasps with surprise and says "that's good, that's good." She notes that she thinks she can tell what to look for as far as symptoms of her anemia go.  When asked if she has had any new  medications since Christmas, she notes that she hasn't. During the physical exam, she largely denies any complaints. She denies any pain when her abdomen is palpated.  In general, she notes that there's a small part of her R eye that's not back to normal; but overall, she remarks that she feels good.  MEDICAL HISTORY:  Past Medical History  Diagnosis Date  . Allergy   . Arthritis   . Diabetes mellitus without complication (HCC)     SURGICAL HISTORY: Past Surgical History  Procedure Laterality Date  . Ankle surgery Left 2006    SOCIAL HISTORY: Social History   Social History  . Marital Status: Married    Spouse Name: N/A  . Number of Children: N/A  . Years of Education: N/A   Occupational History  . Not on file.   Social History Main Topics  . Smoking status: Never Smoker   . Smokeless tobacco: Never Used  . Alcohol Use: No  . Drug Use: No  . Sexual Activity: No   Other Topics Concern  . Not on file   Social History Narrative   She lives with her husband. Two children; no grandchildren.  Her children are 16 and 13. Born in [place]. Non-smoker. Doesn't drink; doesn't do anything.  Doesn't work. She likes walking in the afternoons.  FAMILY HISTORY: History reviewed. No pertinent family history.  Parents live here in [place]; separated Mom lives in [place]; Dad lives here. Not healthy. Mother has had diabetes for the past 40 years. Father moved here from [place] 3 years ago; they found cancer in his legs. His blood wasn't circulating. Father's name is Chief [employer].  ALLERGIES:  has No Known Allergies.  MEDICATIONS:  Current Outpatient Prescriptions  Medication Sig Dispense Refill  . ibuprofen (ADVIL,MOTRIN) 800 MG tablet Take 800 mg by mouth daily as needed for moderate pain.     Marland Kitchen lisinopril (PRINIVIL,ZESTRIL) 2.5 MG tablet Take 2.5 mg by mouth at bedtime.     . metFORMIN (GLUCOPHAGE) 1000 MG tablet Take 1,000 mg by mouth 2 (two) times daily with a meal.     No  current facility-administered medications for this visit.    Review of Systems  Constitutional: Positive for weight loss.  HENT: Negative.   Eyes: Negative.   Respiratory: Negative.   Cardiovascular: Negative.   Genitourinary: Negative.   Musculoskeletal: Negative.   Skin: Negative.   Neurological: Negative.   Endo/Heme/Allergies: Negative.   Psychiatric/Behavioral: Negative.   All other systems reviewed and are negative.  14 point ROS was done and is otherwise as detailed above or in HPI   PHYSICAL EXAMINATION: ECOG PERFORMANCE STATUS: 0 - Asymptomatic  Filed Vitals:  12/22/15 1420  BP: 133/86  Pulse: 106  Temp: 100.1 F (37.8 C)  Resp: 20   Filed Weights   12/22/15 1420  Weight: 135 lb 8 oz (61.462 kg)     Physical Exam  Constitutional: She is oriented to person, place, and time and well-developed, well-nourished, and in no distress.  HENT:  Head: Normocephalic and atraumatic.  Nose: Nose normal.  Mouth/Throat: Oropharynx is clear and moist. No oropharyngeal exudate.  Eyes: Conjunctivae and EOM are normal. Pupils are equal, round, and reactive to light. Right eye exhibits no discharge. Left eye exhibits no discharge. No scleral icterus.  Neck: Normal range of motion. Neck supple. No tracheal deviation present. No thyromegaly present.  Cardiovascular: Normal rate, regular rhythm, normal heart sounds and intact distal pulses.  Exam reveals no gallop and no friction rub.   No murmur heard. Pulmonary/Chest: Effort normal and breath sounds normal. She has no wheezes. She has no rales.  Abdominal: Soft. Bowel sounds are normal. She exhibits no distension and no mass. There is no tenderness. There is no rebound and no guarding.  Musculoskeletal: Normal range of motion. She exhibits no edema.  Lymphadenopathy:    She has no cervical adenopathy.  Neurological: She is alert and oriented to person, place, and time. She has normal reflexes. No cranial nerve deficit. Gait  normal. Coordination normal.  Skin: Skin is warm and dry. No rash noted.  Psychiatric: Mood, memory, affect and judgment normal.  Nursing note and vitals reviewed.     LABORATORY DATA:  I have reviewed the data as listed Lab Results  Component Value Date   WBC 2.7* 02/18/2015   HGB 11.6* 02/18/2015   HCT 34.5* 02/18/2015   MCV 92.0 02/18/2015   PLT 129* 02/18/2015   CMP     Component Value Date/Time   NA 134* 02/18/2015 0403   K 4.6 02/18/2015 0403   CL 103 02/18/2015 0403   CO2 23 02/18/2015 0403   GLUCOSE 171* 02/18/2015 0403   BUN 11 02/18/2015 0403   CREATININE 0.81 02/18/2015 0403   CALCIUM 8.8* 02/18/2015 0403   PROT 7.0 02/17/2015 1901   ALBUMIN 4.0 02/17/2015 1901   AST 33 02/17/2015 1901   ALT 37 02/17/2015 1901   ALKPHOS 86 02/17/2015 1901   BILITOT 0.4 02/17/2015 1901   GFRNONAA >60 02/18/2015 0403   GFRAA >60 02/18/2015 0403     RADIOGRAPHIC STUDIES: I have personally reviewed the radiological images as listed and agreed with the findings in the report. CLINICAL DATA:  Ten day history of RIGHT-sided blurry vision and ocular pain, improving over last several days. Fatigue. History of diabetes.  EXAM: MRI HEAD AND ORBITS WITHOUT AND WITH CONTRAST  TECHNIQUE: Multiplanar, multiecho pulse sequences of the brain and surrounding structures were obtained without and with intravenous contrast. Multiplanar, multiecho pulse sequences of the orbits and surrounding structures were obtained including fat saturation techniques, before and after intravenous contrast administration.  CONTRAST:  10mL MULTIHANCE GADOBENATE DIMEGLUMINE 529 MG/ML IV SOLN  COMPARISON:  None.  FINDINGS: MRI HEAD FINDINGS  The ventricles and sulci are normal for patient's age. No abnormal parenchymal signal, mass lesions, mass effect. No abnormal parenchymal enhancement. No reduced diffusion to suggest acute ischemia. No susceptibility artifact to suggest  hemorrhage.  No abnormal extra-axial fluid collections. No extra-axial masses nor leptomeningeal enhancement. Normal major intracranial vascular flow voids seen at the skull base.  Extensive susceptibility artifact about the oral cavity. Visualized paranasal sinuses and mastoid air cells are well-aerated. No  suspicious calvarial bone marrow signal. No abnormal sellar expansion. Craniocervical junction maintained.  MRI ORBITS FINDINGS  Limited evaluation due to artifact about the oral cavity likely represent braces or dental amalgam.  RIGHT T2 signal and enhancement of the RIGHT optic nerve (posterior intraorbital segment, intracanicular and, proximal cisternal segment). Faint enhancement of the RIGHT retina. No orbital mass. Optic nerve sleeves are normal. Ocular globes intact, lenses are located. Preservation orbital fat. Normal appearance the extraocular muscles. Superior ophthalmic veins are not enlarged.  IMPRESSION: MRI HEAD: Normal MRI of the brain with and without contrast.  MRI ORBITS: Limited evaluation due to artifact about the oral cavity.  Enhancing RIGHT optic nerve compatible with optic neuritis.   Electronically Signed   By: Awilda Metroourtnay  Bloomer M.D.   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 Differential is not available. She also had an episode of optic neuritis last fall. There is certainly a question of underlying autoimmune disease in her presentation.   Labs today show a mild anemia and an ANC of 100. 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 We will obtain all of her records from [facility].  Once all labs are available she will be notified of the results. I feels at this point a BMBX should be obtained for completeness. She will be apprised of lab results once available with  further recommendations to follow. CBC was available after the patient left the clinic today.   Tentatively schedule for a one month return but certainly anticipate follow-up much sooner given her CBC results today.  Orders Placed This Encounter  Procedures  . Lactate dehydrogenase  . CBC with Differential  . Sedimentation rate  . Ferritin  . Vitamin B12  . Folate  . Reticulocytes  . Haptoglobin  . CBC with Differential  . Ferritin  . Comprehensive metabolic panel  . CBC with Differential    Standing Status:   Future    Standing Expiration Date:   12/21/2016  . Lactate dehydrogenase    Standing Status:   Future    Standing Expiration Date:   12/21/2016  . Haptoglobin    Standing Status:   Future    Standing Expiration Date:   12/21/2016    All questions were answered. The patient knows to call the clinic with any problems, questions or concerns.  This document serves as a record of services personally performed by Loma MessingShannon Mirtie Bastyr, MD. It was created on her behalf by Peggye FothergillKatherine Galloway, a trained medical scribe. The creation of this record is based on the scribe's personal observations and the provider's statements to them. This document has been checked and approved by the attending provider.  I have reviewed the above documentation for accuracy and completeness and I agree with the above.  This note was electronically signed.    Arvil ChacoPenland,Armando Lauman Kristen, MD  12/22/2015 3:17 PM

## 2015-12-22 NOTE — Patient Instructions (Signed)
[facility] at [facility] Discharge Instructions  RECOMMENDATIONS MADE BY THE CONSULTANT AND ANY TEST RESULTS WILL BE SENT TO YOUR REFERRING PHYSICIAN.  You were seen by Dr. Galen ManilaPenland today. Lab work today and again in 2 weeks. Return to clinic in one month for follow-up visit and lab work. Call clinic with any questions or concerns.   Medical interpreter was present with you during discussion of visit summary and follow-up instructions.   Thank you for choosing [facility] at [facility] to provide your oncology and hematology care.  To afford each patient quality time with our provider, please arrive at least 15 minutes before your scheduled appointment time.   Beginning January 23rd 2017 lab work for the [facility] will be done in the  Main lab at [facility] on 1st floor. If you have a lab appointment with the Cancer Center please come in thru the  Main Entrance and check in at the main information desk  You need to re-schedule your appointment should you arrive 10 or more minutes late.  We strive to give you quality time with our providers, and arriving late affects you and other patients whose appointments are after yours.  Also, if you no show three or more times for appointments you may be dismissed from the clinic at the providers discretion.     Again, thank you for choosing [facility].  Our hope is that these requests will decrease the amount of time that you wait before being seen by our physicians.       _____________________________________________________________  Should you have questions after your visit to [facility], please contact our office at [phone] between the hours of 8:30 a.m. and 4:30 p.m.  Voicemails left after 4:30 p.m. will not be returned until the following business day.  For prescription refill requests, have your pharmacy contact our office.         Resources For Cancer  Patients and their Caregivers ? American Cancer Society: Can assist with transportation, wigs, general needs, runs Look Good Feel Better.        515-462-18881-([phone] [phone] ? Cancer Care: Provides financial assistance, online support groups, medication/co-pay assistance.  1-800-813-HOPE 5076159592(4673) ? [facility] Assists [place] cancer patients and their families through emotional , educational and financial support.  352 713 [phone] ? [place] DSS Where to apply for food stamps, Medicaid and utility assistance. 940-561-5129256-833-8407 ? [facility]: Transportation to medical appointments. (661)684-1436715-740-5325 ? Social Security Administration: May apply for disability if have a Stage IV cancer. 407-726-2540630-731-4316 (843) 186-16711-(757)048-4507 ? [place] [facility], Disability and Transit Services: Assists with nutrition, care and transit needs. 226-802-5608904-539-7909  Cancer Center Support Programs: @10RELATIVEDAYS @ > Cancer Support Group  2nd Tuesday of the month 1pm-2pm, Journey Room  > Creative Journey  3rd Tuesday of the month 1130am-1pm, Journey Room  > Look Good Feel Better  1st Wednesday of the month 10am-12 noon, Journey Room (Call American Cancer Society to register [phone])

## 2015-12-22 NOTE — Progress Notes (Signed)
CRITICAL VALUE ALERT Critical value received:  WBC 0.7 Date of notification:  12-22-2015 Time of notification: 1610 Critical value read back:  Yes.   Nurse who received alert:  Noe Gensatherine Page RN MD notified (1st page):  DR. Galen ManilaPenland

## 2015-12-23 ENCOUNTER — Other Ambulatory Visit (HOSPITAL_COMMUNITY): Payer: Self-pay | Admitting: *Deleted

## 2015-12-23 DIAGNOSIS — D709 Neutropenia, unspecified: Secondary | ICD-10-CM

## 2015-12-23 DIAGNOSIS — D591 Autoimmune hemolytic anemia, unspecified: Secondary | ICD-10-CM

## 2015-12-23 LAB — CBC WITH DIFFERENTIAL/PLATELET
BASOS ABS: 0 10*3/uL (ref 0.0–0.1)
Basophils Relative: 2 %
EOS PCT: 6 %
Eosinophils Absolute: 0 10*3/uL (ref 0.0–0.7)
HEMATOCRIT: 32.1 % — AB (ref 36.0–46.0)
HEMOGLOBIN: 11 g/dL — AB (ref 12.0–15.0)
LYMPHS ABS: 0.4 10*3/uL — AB (ref 0.7–4.0)
LYMPHS PCT: 54 %
MCH: 31.8 pg (ref 26.0–34.0)
MCHC: 34.3 g/dL (ref 30.0–36.0)
MCV: 92.8 fL (ref 78.0–100.0)
MONO ABS: 0.2 10*3/uL (ref 0.1–1.0)
Monocytes Relative: 22 %
NEUTROS ABS: 0.1 10*3/uL — AB (ref 1.7–7.7)
Neutrophils Relative %: 16 %
Platelets: 161 10*3/uL (ref 150–400)
RBC: 3.46 MIL/uL — ABNORMAL LOW (ref 3.87–5.11)
RDW: 13.4 % (ref 11.5–15.5)
WBC: 0.7 10*3/uL — CL (ref 4.0–10.5)

## 2015-12-23 LAB — HAPTOGLOBIN: Haptoglobin: <10 mg/dL — ABNORMAL LOW (ref 34–200)

## 2015-12-25 ENCOUNTER — Other Ambulatory Visit (HOSPITAL_COMMUNITY): Payer: PRIVATE HEALTH INSURANCE

## 2016-01-01 ENCOUNTER — Telehealth (HOSPITAL_COMMUNITY): Payer: Self-pay

## 2016-01-01 NOTE — Telephone Encounter (Signed)
Returned phone call and spoke with patient's son. I told them to keep all appointments. Also encouraged her to go see her medical doctor to check on her blood sugar levels.

## 2016-01-04 ENCOUNTER — Other Ambulatory Visit: Payer: Self-pay | Admitting: General Surgery

## 2016-01-05 ENCOUNTER — Encounter (HOSPITAL_COMMUNITY): Payer: Self-pay

## 2016-01-05 ENCOUNTER — Ambulatory Visit (HOSPITAL_COMMUNITY)
Admission: RE | Admit: 2016-01-05 | Discharge: 2016-01-05 | Disposition: A | Discharge status: Home / Self Care | Payer: PRIVATE HEALTH INSURANCE | Source: Ambulatory Visit | Attending: Hematology & Oncology | Admitting: Hematology & Oncology

## 2016-01-05 ENCOUNTER — Ambulatory Visit (HOSPITAL_COMMUNITY)
Admission: RE | Admit: 2016-01-05 | Discharge: 2016-01-05 | Disposition: A | Discharge status: Home / Self Care | Payer: MEDICAID | Source: Ambulatory Visit | Attending: Hematology & Oncology | Admitting: Hematology & Oncology

## 2016-01-05 ENCOUNTER — Other Ambulatory Visit (HOSPITAL_COMMUNITY): Payer: Self-pay | Admitting: Emergency Medicine

## 2016-01-05 DIAGNOSIS — D591 Autoimmune hemolytic anemia, unspecified: Secondary | ICD-10-CM

## 2016-01-05 DIAGNOSIS — D61818 Other pancytopenia: Secondary | ICD-10-CM | POA: Insufficient documentation

## 2016-01-05 DIAGNOSIS — M1990 Unspecified osteoarthritis, unspecified site: Secondary | ICD-10-CM | POA: Insufficient documentation

## 2016-01-05 DIAGNOSIS — D7589 Other specified diseases of blood and blood-forming organs: Secondary | ICD-10-CM | POA: Insufficient documentation

## 2016-01-05 DIAGNOSIS — I10 Essential (primary) hypertension: Secondary | ICD-10-CM | POA: Insufficient documentation

## 2016-01-05 DIAGNOSIS — E119 Type 2 diabetes mellitus without complications: Secondary | ICD-10-CM | POA: Insufficient documentation

## 2016-01-05 DIAGNOSIS — D709 Neutropenia, unspecified: Secondary | ICD-10-CM

## 2016-01-05 DIAGNOSIS — D588 Other specified hereditary hemolytic anemias: Secondary | ICD-10-CM | POA: Insufficient documentation

## 2016-01-05 DIAGNOSIS — Z7984 Long term (current) use of oral hypoglycemic drugs: Secondary | ICD-10-CM | POA: Insufficient documentation

## 2016-01-05 DIAGNOSIS — R0602 Shortness of breath: Secondary | ICD-10-CM | POA: Insufficient documentation

## 2016-01-05 DIAGNOSIS — D708 Other neutropenia: Secondary | ICD-10-CM | POA: Insufficient documentation

## 2016-01-05 HISTORY — DX: Anemia, unspecified: D64.9

## 2016-01-05 HISTORY — DX: Essential (primary) hypertension: I10

## 2016-01-05 HISTORY — DX: Unspecified visual disturbance: H53.9

## 2016-01-05 HISTORY — DX: Reserved for inherently not codable concepts without codable children: IMO0001

## 2016-01-05 LAB — CBC WITH DIFFERENTIAL/PLATELET
BASOS ABS: 0 10*3/uL (ref 0.0–0.1)
Basophils Relative: 2 %
Eosinophils Absolute: 0 10*3/uL (ref 0.0–0.7)
Eosinophils Relative: 2 %
HCT: 15.5 % — ABNORMAL LOW (ref 36.0–46.0)
HEMOGLOBIN: 5.2 g/dL — AB (ref 12.0–15.0)
LYMPHS ABS: 0.5 10*3/uL — AB (ref 0.7–4.0)
LYMPHS PCT: 37 %
MCH: 31.7 pg (ref 26.0–34.0)
MCHC: 33.5 g/dL (ref 30.0–36.0)
MCV: 94.5 fL (ref 78.0–100.0)
MONOS PCT: 24 %
Monocytes Absolute: 0.3 10*3/uL (ref 0.1–1.0)
Neutro Abs: 0.5 10*3/uL — ABNORMAL LOW (ref 1.7–7.7)
Neutrophils Relative %: 35 %
Platelets: 83 10*3/uL — ABNORMAL LOW (ref 150–400)
RBC: 1.64 MIL/uL — AB (ref 3.87–5.11)
RDW: 17.1 % — ABNORMAL HIGH (ref 11.5–15.5)
WBC: 1.3 10*3/uL — AB (ref 4.0–10.5)

## 2016-01-05 LAB — CBC
HEMATOCRIT: 16.2 % — AB (ref 36.0–46.0)
Hemoglobin: 5.5 g/dL — CL (ref 12.0–15.0)
MCH: 32 pg (ref 26.0–34.0)
MCHC: 34 g/dL (ref 30.0–36.0)
MCV: 94.2 fL (ref 78.0–100.0)
Platelets: 85 10*3/uL — ABNORMAL LOW (ref 150–400)
RBC: 1.72 MIL/uL — ABNORMAL LOW (ref 3.87–5.11)
RDW: 16.9 % — AB (ref 11.5–15.5)
WBC: 1.5 10*3/uL — ABNORMAL LOW (ref 4.0–10.5)

## 2016-01-05 LAB — APTT: aPTT: 38 seconds — ABNORMAL HIGH (ref 24–37)

## 2016-01-05 LAB — GLUCOSE, CAPILLARY
GLUCOSE-CAPILLARY: 298 mg/dL — AB (ref 65–99)
Glucose-Capillary: 301 mg/dL — ABNORMAL HIGH (ref 65–99)

## 2016-01-05 LAB — BONE MARROW EXAM

## 2016-01-05 LAB — PROTIME-INR
INR: 1.28 (ref 0.00–1.49)
Prothrombin Time: 16.1 seconds — ABNORMAL HIGH (ref 11.6–15.2)

## 2016-01-05 MED ORDER — HYDROCODONE-ACETAMINOPHEN 5-325 MG PO TABS
1.0000 | ORAL_TABLET | ORAL | Status: DC | PRN
  Filled 2016-01-05: qty 2

## 2016-01-05 MED ORDER — PREDNISONE 20 MG PO TABS: 60.0000 mg | ORAL_TABLET | Freq: Every day | ORAL | Status: DC

## 2016-01-05 MED ORDER — FENTANYL CITRATE (PF) 100 MCG/2ML IJ SOLN
INTRAMUSCULAR | Status: AC
  Filled 2016-01-05: qty 4

## 2016-01-05 MED ORDER — MIDAZOLAM HCL 2 MG/2ML IJ SOLN
INTRAMUSCULAR | Status: AC | PRN
  Administered 2016-01-05 (×2): 1 mg via INTRAVENOUS

## 2016-01-05 MED ORDER — FENTANYL CITRATE (PF) 100 MCG/2ML IJ SOLN
INTRAMUSCULAR | Status: AC | PRN
  Administered 2016-01-05: 50 ug via INTRAVENOUS

## 2016-01-05 MED ORDER — SODIUM CHLORIDE 0.9 % IV SOLN
INTRAVENOUS | Status: DC
Start: 2016-01-05 — End: 2016-01-06
  Administered 2016-01-05: 09:00:00 via INTRAVENOUS

## 2016-01-05 MED ORDER — MIDAZOLAM HCL 2 MG/2ML IJ SOLN
INTRAMUSCULAR | Status: AC
  Filled 2016-01-05: qty 6

## 2016-01-05 NOTE — Progress Notes (Signed)
CRITICAL VALUE ALERT  Critical value received:  5.5 hemoglobin   Date of notification:  7/21  Time of notification:  0955  Critical value read back:Yes.    Nurse who received alert:  Jamelle RushingSusan Ragan Reale RN   MD notified (1st page):  Barnetta ChapelKelly Osborne PA  Time of first page:  [phone]  MD notified (2nd page):not needed  Time of second page:not needed  Responding MD: Barnetta ChapelKelly Osborne PA  Time MD responded:  [phone]

## 2016-01-05 NOTE — Progress Notes (Signed)
1100-tried to call with the interpreter. Called daughter back. Then another number given.  [phone].  Left a message for them to call back to the clinic because it was urgent.  Prednisone called into the pharmacy.  Repeat CBC on Monday.  Hannah Dickson spoke with the daughter and son. Verbalized understanding. Wait for the labs on Monday to come back to call haley or Hannah Dickson for results.

## 2016-01-05 NOTE — H&P (Signed)
Chief Complaint: anemia  Referring Physician:Dr. Ancil Linsey  Supervising Physician: Jacqulynn Cadet  Patient Status: Out-pt  HPI: Hannah Dickson is an 50 y.o. female with poorly controlled DM who has been found to have hemolytic anemia.  She is being followed by Dr. Whitney Muse.  The patient has been feeling well recently.  Her blood sugars routinely run between 300-500.  They were 516 this morning, but her current CBG is 298.  She states this is very good for her.  She denies CP, vision changes, headaches, etc.  A request has been made for a bone marrow biopsy secondary to her anemia.  Past Medical History:  Past Medical History  Diagnosis Date  . Allergy   . Arthritis   . Diabetes mellitus without complication ([place])   . Hypertension   . Shortness of breath dyspnea     slight according to patient due to anemia  . Anemia   . Visual changes     Past Surgical History:  Past Surgical History  Procedure Laterality Date  . Ankle surgery Left 2006  . Cesarean section      Family History: History reviewed. No pertinent family history.  Social History:  reports that she has never smoked. She has never used smokeless tobacco. She reports that she does not drink alcohol or use illicit drugs.  Allergies: No Known Allergies  Medications:   Medication List    ASK your doctor about these medications        ibuprofen 800 MG tablet  Commonly known as:  ADVIL,MOTRIN  Take 800 mg by mouth daily as needed for moderate pain.     lisinopril 2.5 MG tablet  Commonly known as:  PRINIVIL,ZESTRIL  Take 2.5 mg by mouth at bedtime.     metFORMIN 1000 MG tablet  Commonly known as:  GLUCOPHAGE  Take 1,000 mg by mouth 2 (two) times daily with a meal.        Please HPI for pertinent positives, otherwise complete 10 system ROS negative.  Mallampati Score: MD Evaluation Airway: WNL Heart: WNL Abdomen: WNL Chest/ Lungs: WNL ASA  Classification: 2 Mallampati/Airway  Score: Two  Physical Exam: BP 135/74 mmHg  Pulse 114  Temp(Src) 98.6 F (37 C) (Oral)  Resp 20  SpO2 100% There is no weight on file to calculate BMI. General: pleasant, WD, WN Hispanic female who is laying in bed in NAD HEENT: head is normocephalic, atraumatic.  Sclera are noninjected.  PERRL.  Ears and nose without any masses or lesions.  Mouth is pink and moist Heart: regular, rate, and rhythm.  Normal s1,s2. No obvious murmurs, gallops, or rubs noted.  Palpable radial and pedal pulses bilaterally Lungs: CTAB, no wheezes, rhonchi, or rales noted.  Respiratory effort nonlabored Abd: soft, NT, ND, +BS, no masses, hernias, or organomegaly MS: all 4 extremities are symmetrical with no cyanosis, clubbing, or edema. Psych: A&Ox3 with an appropriate affect.   Labs: Results for orders placed or performed during the hospital encounter of 01/05/16 (from the past 48 hour(s))  APTT upon arrival     Status: Abnormal   Collection Time: 01/05/16  9:15 AM  Result Value Ref Range   aPTT 38 (H) 24 - 37 seconds    Comment:        IF BASELINE aPTT IS ELEVATED, SUGGEST PATIENT RISK ASSESSMENT BE USED TO DETERMINE APPROPRIATE ANTICOAGULANT THERAPY.   Protime-INR upon arrival     Status: Abnormal   Collection Time: 01/05/16  9:15 AM  Result  Value Ref Range   Prothrombin Time 16.1 (H) 11.6 - 15.2 seconds   INR 1.28 0.00 - 1.49    Imaging: No results found.  Assessment/Plan 1. Hemolytic anemia -vitals have been reviewed -labs are still pending, but critical value of hgb of 5.5 has been reported, d/w MD as well.   -cbg is 298.  This has been d/w MD.  We have recommended the patient follow up with her PCP for better management of her DM. -Risks and Benefits discussed with the patient including, but not limited to bleeding, infection, damage to adjacent structures or low yield requiring additional tests. All of the patient's questions were answered, patient is agreeable to proceed. Consent  signed and in chart.   Thank you for this interesting consult.  I greatly enjoyed meeting [facility] and look forward to participating in their care.  A copy of this report was sent to the requesting provider on this date.  Electronically Signed: Henreitta Cea 01/05/2016, 9:50 AM   I spent a total of  30 Minutes   in face to face in clinical consultation, greater than 50% of which was counseling/coordinating care for hemolytic anemia

## 2016-01-05 NOTE — Progress Notes (Unsigned)
CRITICAL VALUE ALERT Critical value received:  WBC 1.3, HGB-5.2 Date of notification:  01/05/16 Time of notification: 1525 Critical value read back:  Yes.   Nurse who received alert:  Hester MatesWhitney Lezette Kitts, RN MD notified (1st page):  Dr. Vinnie LevelPenland  Haley has already addressed this with the patient.  No further instruction needed.

## 2016-01-05 NOTE — Progress Notes (Unsigned)
Tried to call pt, spoke with daughter, she gave me another number to try to get in touch with the pt.  Left a message for them to call us back that it was very important.  I called in 60 mg of prednisone to [facility] in [place].  Also made pt appt for lab work on Monday at 12:40pm to repeat her blood counts.

## 2016-01-05 NOTE — Procedures (Signed)
Interventional Radiology Procedure Note  Procedure: CT guided aspirate and core biopsy of right iliac bone Complications: None Recommendations: - Bedrest supine x 1 hrs - Hydrocodone PRN  Pain - Follow biopsy results  Signed,  Tynell Winchell K. Ahria Slappey, MD   

## 2016-01-05 NOTE — Discharge Instructions (Signed)
Aspiracin y biopsia de mdula sea, cuidados posteriores (Bone Marrow Aspiration and Bone Marrow Biopsy, Care After) Siga estas instrucciones durante las prximas semanas. Estas indicaciones le proporcionan informacin acerca de cmo deber cuidarse despus del procedimiento. El mdico tambin podr darle instrucciones ms especficas. El tratamiento ha sido planificado segn las prcticas mdicas actuales, pero en algunos casos pueden ocurrir problemas. Comunquese con el mdico si tiene algn problema o tiene dudas despus del procedimiento. QU ESPERAR DESPUS DEL PROCEDIMIENTO Despus del procedimiento, es comn [employer] siguientes sntomas:  Dolor o sensibilidad a la palpacin alrededor del Environmental consultant de la puncin.  Hematomas. Rincon los medicamentos solamente como se lo haya indicado el [place] instrucciones del mdico con respecto a lo siguiente:  Cuidado del Environmental consultant de la puncin.  Cambiar y Music therapist venda (vendaje).  Bese y dchese como se lo haya indicado el mdico.  Controle el sitio de la puncin todos los das para descartar signos de infeccin. Est atento a lo siguiente:  Dolor, hinchazn o enrojecimiento.  Lquido, sangre o pus.  Reanude sus actividades normales como se lo haya indicado el mdico.  Concurra a todas las visitas de control como se lo haya indicado el mdico. Esto es importante. SOLICITE ATENCIN MDICA SI:  Jaclynn Guarneri.  Tiene una hemorragia incontrolable.  Tiene enrojecimiento, hinchazn o Management consultant de la puncin.  Observa lquido, sangre o pus que salen del lugar de la puncin.   Esta informacin no tiene Marine scientist el consejo del mdico. Asegrese de hacerle al mdico cualquier pregunta que tenga.   Document Released: 09/28/2012 Document Revised: 10/18/2014 Elsevier Interactive Patient Education 2016 [place] consciente moderada en los adultos, cuidados  posteriores (Moderate Conscious Sedation, Adult, Care After) Siga estas instrucciones durante las prximas semanas. Estas indicaciones le proporcionan informacin general acerca de cmo deber cuidarse despus del procedimiento. El mdico tambin podr darle instrucciones ms especficas. El tratamiento ha sido planificado segn las prcticas mdicas actuales, pero en algunos casos pueden ocurrir problemas. Comunquese con el mdico si tiene algn problema o tiene dudas despus del procedimiento. QU ESPERAR DESPUS DEL PROCEDIMIENTO  Despus del procedimiento:  Tal vez se sienta adormecido, torpe y tenga problemas de equilibrio durante varias horas.  Si comienza a Mudlogger pronto despus del procedimiento, puede tener vmitos. INSTRUCCIONES PARA EL CUIDADO EN EL HOGAR  No participe en ninguna actividad en la que podra resultar lesionado durante, al [place], [place]. No haga lo siguiente:  Conduzca vehculos.  Practique natacin.  Ande en bicicleta.  Opere maquinarias pesadas.  Cocine.  Utilice herramientas.  Suba escaleras.  Trabaje en altura.  No firme documentos legales ni tome decisiones trascendentes hasta que se sienta mejor.  Si vomita, tome agua, jugo o sopa una vez que pueda beber sin vomitar. Asegrese de no tener nuseas antes de ingerir alimentos slidos.  Utilice los medicamentos de venta libre o recetados para Glass blower/designer, el malestar o la fiebre, segn se lo indique el mdico.  Asegrese de que usted y su familia comprenden totalmente todo lo relacionado con los medicamentos que le han administrado, y los efectos secundarios que pudiera sufrir.  No tome alcohol, pastillas para dormir o medicamentos que causen somnolencia durante, al menos, 24horas.  Si fuma, evite fumar sin supervisin.  Si se siente mejor, puede retomar sus actividades habituales luego de 24horas de la sedacin.  Concurra a todos los controles con su [place]  MDICA SI:  Su piel est plida o de color azulado.  Contina sintiendo nuseas o vomita.  El dolor est empeorando y no puede controlarlo con los medicamentos.  Tiene hemorragia o hinchazn.  Todava se siente somnoliento o tembloroso despus de 24 horas. SOLICITE ATENCIN MDICA DE INMEDIATO SI:  Le aparece una erupcin cutnea.  Tiene dificultad para respirar.  Tiene algn problema alrgico.  Jaclynn Guarneri. ASEGRESE DE QUE:  Comprende estas instrucciones.  Controlar su afeccin.  Recibir ayuda de inmediato si no mejora o si empeora.   Esta informacin no tiene Marine scientist el consejo del mdico. Asegrese de hacerle al mdico cualquier pregunta que tenga.   Document Released: 06/08/2013 Document Revised: 06/24/2014 Elsevier Interactive Patient Education 2016 [place].    Bone Marrow Aspiration and Bone Marrow Biopsy, Care After Refer to this sheet in the next few weeks. These instructions provide you with information about caring for yourself after your procedure. Your health care provider may also give you more specific instructions. Your treatment has been planned according to current medical practices, but problems sometimes occur. Call your health care provider if you have any problems or questions after your procedure. WHAT TO EXPECT AFTER THE PROCEDURE After your procedure, it is common to have:  Soreness or tenderness around the puncture site.  Bruising. HOME CARE INSTRUCTIONS  Take medicines only as directed by your health care provider.  Follow your health care provider's instructions about:  Puncture site care.  Bandage (dressing) changes and removal.  Bathe and shower as directed by your health care provider.  Check your puncture site every day for signs of infection. Watch for:  Redness, swelling, or pain.  Fluid, blood, or pus.  Return to your normal activities as directed by your health care provider.  Keep all follow-up  visits as directed by your health care provider. This is important. SEEK MEDICAL CARE IF:  You have a fever.  You have uncontrollable bleeding.  You have redness, swelling, or pain at the site of your puncture.  You have fluid, blood, or pus coming from your puncture site.   This information is not intended to replace advice given to you by your health care provider. Make sure you discuss any questions you have with your health care provider.   Document Released: 12/21/2004 Document Revised: 10/18/2014 Document Reviewed: 05/25/2014 Elsevier Interactive Patient Education [employer].

## 2016-01-08 ENCOUNTER — Encounter (HOSPITAL_COMMUNITY): Payer: Self-pay | Admitting: Emergency Medicine

## 2016-01-08 ENCOUNTER — Telehealth (HOSPITAL_COMMUNITY): Payer: Self-pay

## 2016-01-08 ENCOUNTER — Other Ambulatory Visit: Payer: Self-pay

## 2016-01-08 ENCOUNTER — Encounter (HOSPITAL_COMMUNITY): Payer: Self-pay

## 2016-01-08 ENCOUNTER — Inpatient Hospital Stay (HOSPITAL_COMMUNITY)
Admission: EM | Admit: 2016-01-08 | Discharge: 2016-01-10 | DRG: 809 | Disposition: A | Discharge status: Other Acute Inpatient Hospital | Payer: PRIVATE HEALTH INSURANCE | Attending: Internal Medicine | Admitting: Internal Medicine

## 2016-01-08 DIAGNOSIS — D61818 Other pancytopenia: Principal | ICD-10-CM | POA: Diagnosis present

## 2016-01-08 DIAGNOSIS — I959 Hypotension, unspecified: Secondary | ICD-10-CM | POA: Diagnosis present

## 2016-01-08 DIAGNOSIS — IMO0002 Reserved for concepts with insufficient information to code with codable children: Secondary | ICD-10-CM | POA: Insufficient documentation

## 2016-01-08 DIAGNOSIS — D599 Acquired hemolytic anemia, unspecified: Secondary | ICD-10-CM

## 2016-01-08 DIAGNOSIS — Z809 Family history of malignant neoplasm, unspecified: Secondary | ICD-10-CM

## 2016-01-08 DIAGNOSIS — D591 Autoimmune hemolytic anemia, unspecified: Secondary | ICD-10-CM

## 2016-01-08 DIAGNOSIS — E119 Type 2 diabetes mellitus without complications: Secondary | ICD-10-CM | POA: Diagnosis not present

## 2016-01-08 DIAGNOSIS — D72819 Decreased white blood cell count, unspecified: Secondary | ICD-10-CM | POA: Diagnosis present

## 2016-01-08 DIAGNOSIS — D5911 Warm autoimmune hemolytic anemia: Secondary | ICD-10-CM | POA: Diagnosis present

## 2016-01-08 DIAGNOSIS — D649 Anemia, unspecified: Secondary | ICD-10-CM | POA: Diagnosis not present

## 2016-01-08 DIAGNOSIS — I10 Essential (primary) hypertension: Secondary | ICD-10-CM | POA: Diagnosis present

## 2016-01-08 DIAGNOSIS — E1165 Type 2 diabetes mellitus with hyperglycemia: Secondary | ICD-10-CM | POA: Insufficient documentation

## 2016-01-08 DIAGNOSIS — E861 Hypovolemia: Secondary | ICD-10-CM | POA: Diagnosis present

## 2016-01-08 DIAGNOSIS — D696 Thrombocytopenia, unspecified: Secondary | ICD-10-CM | POA: Diagnosis present

## 2016-01-08 DIAGNOSIS — E876 Hypokalemia: Secondary | ICD-10-CM | POA: Diagnosis present

## 2016-01-08 DIAGNOSIS — E785 Hyperlipidemia, unspecified: Secondary | ICD-10-CM | POA: Diagnosis present

## 2016-01-08 DIAGNOSIS — E871 Hypo-osmolality and hyponatremia: Secondary | ICD-10-CM | POA: Diagnosis present

## 2016-01-08 DIAGNOSIS — Z833 Family history of diabetes mellitus: Secondary | ICD-10-CM

## 2016-01-08 DIAGNOSIS — R739 Hyperglycemia, unspecified: Secondary | ICD-10-CM

## 2016-01-08 HISTORY — DX: Acquired hemolytic anemia, unspecified: D59.9

## 2016-01-08 LAB — FERRITIN: Ferritin: 242 ng/mL (ref 11–307)

## 2016-01-08 LAB — CBC WITH DIFFERENTIAL/PLATELET
BASOS ABS: 0.1 10*3/uL (ref 0.0–0.1)
BASOS PCT: 1 %
EOS ABS: 0 10*3/uL (ref 0.0–0.7)
Eosinophils Relative: 0 %
HCT: 14.8 % — ABNORMAL LOW (ref 36.0–46.0)
Hemoglobin: 4.8 g/dL — CL (ref 12.0–15.0)
LYMPHS PCT: 16 %
Lymphs Abs: 0.8 10*3/uL (ref 0.7–4.0)
MCH: 33.3 pg (ref 26.0–34.0)
MCHC: 32.4 g/dL (ref 30.0–36.0)
MCV: 102.8 fL — ABNORMAL HIGH (ref 78.0–100.0)
MONOS PCT: 14 %
Monocytes Absolute: 0.7 10*3/uL (ref 0.1–1.0)
NEUTROS PCT: 69 %
Neutro Abs: 3.6 10*3/uL (ref 1.7–7.7)
PLATELETS: 88 10*3/uL — AB (ref 150–400)
RBC: 1.44 MIL/uL — ABNORMAL LOW (ref 3.87–5.11)
RDW: 22.5 % — ABNORMAL HIGH (ref 11.5–15.5)
WBC: 5.2 10*3/uL (ref 4.0–10.5)

## 2016-01-08 LAB — BASIC METABOLIC PANEL
ANION GAP: 5 (ref 5–15)
BUN: 16 mg/dL (ref 6–20)
CALCIUM: 8.4 mg/dL — AB (ref 8.9–10.3)
CO2: 20 mmol/L — ABNORMAL LOW (ref 22–32)
Chloride: 104 mmol/L (ref 101–111)
Creatinine, Ser: 0.96 mg/dL (ref 0.44–1.00)
Glucose, Bld: 428 mg/dL — ABNORMAL HIGH (ref 65–99)
Potassium: 4.9 mmol/L (ref 3.5–5.1)
Sodium: 129 mmol/L — ABNORMAL LOW (ref 135–145)

## 2016-01-08 LAB — IRON AND TIBC
Iron: 215 ug/dL — ABNORMAL HIGH (ref 28–170)
SATURATION RATIOS: 75 % — AB (ref 10.4–31.8)
TIBC: 287 ug/dL (ref 250–450)
UIBC: 72 ug/dL

## 2016-01-08 LAB — HEPATIC FUNCTION PANEL
ALK PHOS: 88 U/L (ref 38–126)
ALT: 24 U/L (ref 14–54)
AST: 24 U/L (ref 15–41)
Albumin: 3.6 g/dL (ref 3.5–5.0)
BILIRUBIN DIRECT: 0.3 mg/dL (ref 0.1–0.5)
BILIRUBIN INDIRECT: 1.5 mg/dL — AB (ref 0.3–0.9)
Total Bilirubin: 1.8 mg/dL — ABNORMAL HIGH (ref 0.3–1.2)
Total Protein: 6.5 g/dL (ref 6.5–8.1)

## 2016-01-08 LAB — RETICULOCYTES
RBC.: 1.45 MIL/uL — AB (ref 3.87–5.11)
RETIC CT PCT: 17.2 % — AB (ref 0.4–3.1)
Retic Count, Absolute: 249.4 10*3/uL — ABNORMAL HIGH (ref 19.0–186.0)

## 2016-01-08 LAB — GLUCOSE, CAPILLARY: Glucose-Capillary: 395 mg/dL — ABNORMAL HIGH (ref 65–99)

## 2016-01-08 LAB — POC OCCULT BLOOD, ED: Fecal Occult Bld: NEGATIVE

## 2016-01-08 LAB — VITAMIN B12: Vitamin B-12: 2804 pg/mL — ABNORMAL HIGH (ref 180–914)

## 2016-01-08 LAB — PROTIME-INR
INR: 1.27 (ref 0.00–1.49)
INR: 1.28 (ref 0.00–1.49)
Prothrombin Time: 16 seconds — ABNORMAL HIGH (ref 11.6–15.2)
Prothrombin Time: 16.2 seconds — ABNORMAL HIGH (ref 11.6–15.2)

## 2016-01-08 LAB — PREPARE RBC (CROSSMATCH)

## 2016-01-08 LAB — FOLATE: FOLATE: 22.8 ng/mL (ref >5.9)

## 2016-01-08 LAB — APTT: aPTT: 30 seconds (ref 24–37)

## 2016-01-08 LAB — ABO/RH: ABO/RH(D): O POS

## 2016-01-08 MED ORDER — ACETAMINOPHEN 325 MG PO TABS
650.0000 mg | ORAL_TABLET | Freq: Four times a day (QID) | ORAL | Status: DC | PRN
Start: 2016-01-08 — End: 2016-01-10
  Administered 2016-01-08: 650 mg via ORAL
  Filled 2016-01-08: qty 2

## 2016-01-08 MED ORDER — SODIUM CHLORIDE 0.9% FLUSH
3.0000 mL | Freq: Two times a day (BID) | INTRAVENOUS | Status: DC
  Administered 2016-01-08 – 2016-01-10 (×3): 3 mL via INTRAVENOUS

## 2016-01-08 MED ORDER — SODIUM CHLORIDE 0.9 % IV SOLN: Freq: Once | INTRAVENOUS | Status: DC

## 2016-01-08 MED ORDER — INSULIN ASPART 100 UNIT/ML ~~LOC~~ SOLN
0.0000 [IU] | Freq: Every day | SUBCUTANEOUS | Status: DC
  Administered 2016-01-08: 3 [IU] via SUBCUTANEOUS
  Administered 2016-01-09: 5 [IU] via SUBCUTANEOUS

## 2016-01-08 MED ORDER — INSULIN ASPART 100 UNIT/ML ~~LOC~~ SOLN
0.0000 [IU] | Freq: Three times a day (TID) | SUBCUTANEOUS | Status: DC
  Administered 2016-01-09: 2 [IU] via SUBCUTANEOUS
  Administered 2016-01-09: 9 [IU] via SUBCUTANEOUS
  Administered 2016-01-09: 5 [IU] via SUBCUTANEOUS
  Administered 2016-01-10: 2 [IU] via SUBCUTANEOUS
  Administered 2016-01-10 (×2): 9 [IU] via SUBCUTANEOUS

## 2016-01-08 MED ORDER — SODIUM CHLORIDE 0.9 % IV BOLUS (SEPSIS)
1000.0000 mL | Freq: Once | INTRAVENOUS | Status: AC
  Administered 2016-01-08: 1000 mL via INTRAVENOUS

## 2016-01-08 MED ORDER — INSULIN ASPART 100 UNIT/ML ~~LOC~~ SOLN
8.0000 [IU] | Freq: Once | SUBCUTANEOUS | Status: AC
  Administered 2016-01-08: 8 [IU] via SUBCUTANEOUS
  Filled 2016-01-08: qty 1

## 2016-01-08 MED ORDER — PREDNISONE 20 MG PO TABS
60.0000 mg | ORAL_TABLET | Freq: Every day | ORAL | Status: DC
  Administered 2016-01-10: 60 mg via ORAL
  Filled 2016-01-08: qty 3

## 2016-01-08 MED ORDER — METFORMIN HCL 500 MG PO TABS
1000.0000 mg | ORAL_TABLET | Freq: Two times a day (BID) | ORAL | Status: DC
  Administered 2016-01-09 – 2016-01-10 (×4): 1000 mg via ORAL
  Filled 2016-01-08 (×4): qty 2

## 2016-01-08 MED ORDER — ACETAMINOPHEN 325 MG PO TABS
650.0000 mg | ORAL_TABLET | Freq: Once | ORAL | Status: AC
  Administered 2016-01-09: 650 mg via ORAL
  Filled 2016-01-08: qty 2

## 2016-01-08 MED ORDER — DIPHENHYDRAMINE HCL 25 MG PO CAPS
25.0000 mg | ORAL_CAPSULE | Freq: Once | ORAL | Status: AC
  Administered 2016-01-09: 25 mg via ORAL
  Filled 2016-01-08: qty 1

## 2016-01-08 MED ORDER — SODIUM CHLORIDE 0.9 % IV SOLN: 10.0000 mL/h | Freq: Once | INTRAVENOUS | Status: DC

## 2016-01-08 MED ORDER — ACETAMINOPHEN 650 MG RE SUPP: 650.0000 mg | Freq: Four times a day (QID) | RECTAL | Status: DC | PRN

## 2016-01-08 MED ORDER — LISINOPRIL 5 MG PO TABS
2.5000 mg | ORAL_TABLET | Freq: Every day | ORAL | Status: DC
  Administered 2016-01-08: 2.5 mg via ORAL
  Filled 2016-01-08: qty 1

## 2016-01-08 MED ORDER — SODIUM CHLORIDE 0.9 % IV SOLN: INTRAVENOUS | Status: DC

## 2016-01-08 NOTE — ED Triage Notes (Addendum)
Interpreter used to complete triage. Pt reports had blood work completed today and reports was called and told the hemoglobin "too low". Pt reports intermittent dyspnea and weakness and denies cp. No dyspnea noted at this time.

## 2016-01-08 NOTE — Telephone Encounter (Signed)
CRITICAL VALUE ALERT Critical value received:  Hemoglobin  Date of notification:  01/08/2016 Time of notification: 1300 Critical value read back:  Yes  Nurse who received alert:  M.Anielle Headrick, LPN MD notified (1st page):  Jenita Seashore, PA-C  Called patient, per PA-C, to let her know she needs to come to the ER to be admitted because of her hemoglobin. Patient does not speak english, spoke with Lynne Logan, patient son who translated. He states she will be on the way to the ER at [facility] shortly.

## 2016-01-08 NOTE — ED Provider Notes (Signed)
[place] DEPT Provider Note   CSN: [id number] Arrival date & time: 01/08/16  1407  First Provider Contact:  First MD Initiated Contact with Patient 01/08/16 1500        History   Chief Complaint Chief Complaint  Patient presents with  . Abnormal Lab    HPI Hannah Dickson is a 50 y.o. female.  Patient sent by hematology clinic for anemia and hemoglobin of 4. She had a transfusion in May and was thought to have hemolytic anemia and was transfused in [place]. She was seen by Dr. Whitney Muse who is concerned for hemolytic anemia. She's been 100 mg of prednisone daily without success. She had a bone marrow biopsy on July 21 of the results are not known. Patient denies any chest pain or shortness of breath. She does have dyspnea with exertion and some dizziness and lightheadedness. Denies any nausea or vomiting. Denies any vomiting or blood in the stool. D/w Kirby Crigler PAC who feels this is not hemolytic anemia as it not improving on prednisone.  He recommends admission for transfusion and further workup of autoimmune diseases.   The history is provided by the patient. The history is limited by a language barrier.  Abnormal Lab    Past Medical History:  Diagnosis Date  . Allergy   . Anemia   . Arthritis   . Diabetes mellitus without complication ([place])   . Hypertension   . Shortness of breath dyspnea    slight according to patient due to anemia  . Visual changes     Patient Active Problem List   Diagnosis Date Noted  . 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 Optic neuritis   . Visual loss 02/17/2015  . Diabetes mellitus, type 2 ([place]) 02/17/2015    Past Surgical History:  Procedure Laterality Date  . ANKLE SURGERY Left 2006  . CESAREAN SECTION      OB History    No data available       Home Medications    Prior to Admission medications   Medication Sig Start Date End Date Taking? Authorizing Provider  ibuprofen (ADVIL,MOTRIN) 800 MG tablet  Take 800 mg by mouth daily as needed for moderate pain.     Historical Provider, MD  lisinopril (PRINIVIL,ZESTRIL) 2.5 MG tablet Take 2.5 mg by mouth at bedtime.     Historical Provider, MD  metFORMIN (GLUCOPHAGE) 1000 MG tablet Take 1,000 mg by mouth 2 (two) times daily with a meal.    Historical Provider, MD  predniSONE (DELTASONE) 20 MG tablet Take 3 tablets (60 mg total) by mouth daily with breakfast. 01/05/16   Patrici Ranks, MD    Family History History reviewed. No pertinent family history.  Social History Social History  Substance Use Topics  . Smoking status: Never Smoker  . Smokeless tobacco: Never Used  . Alcohol use No     Allergies   Review of patient's allergies indicates no known allergies.   Review of Systems Review of Systems  Constitutional: Negative for activity change, appetite change and fever.  HENT: Negative for congestion.   Respiratory: Positive for shortness of breath. Negative for chest tightness.   Cardiovascular: Negative for chest pain.  Gastrointestinal: Negative for abdominal pain, nausea and vomiting.  Genitourinary: Negative for vaginal bleeding and vaginal discharge.  Neurological: Positive for dizziness and weakness.   A complete 10 system review of systems was obtained and all systems are negative except as noted in the HPI and PMH.    Physical Exam  Updated Vital Signs BP 132/70 (BP Location: Left Arm)   Pulse 98   Temp 98.3 F (36.8 C) (Oral)   Resp 18   Ht '4\' 6"'$  (1.372 m)   Wt 136 lb (61.7 kg)   SpO2 100%   BMI 32.79 kg/m   Physical Exam  Constitutional: She is oriented to person, place, and time. She appears well-developed and well-nourished. No distress.  HENT:  Head: Normocephalic and atraumatic.  Mouth/Throat: Oropharynx is clear and moist. No oropharyngeal exudate.  Eyes: Conjunctivae and EOM are normal. Pupils are equal, round, and reactive to light.  Pallor, pale conjunctiva  Neck: Normal range of motion. Neck  supple.  No meningismus.  Cardiovascular: Normal rate, regular rhythm, normal heart sounds and intact distal pulses.   No murmur heard. Pulmonary/Chest: Effort normal and breath sounds normal. No respiratory distress.  Abdominal: Soft. There is no tenderness. There is no rebound and no guarding.  Genitourinary:  Genitourinary Comments: Rectal exam performed chaperone present, no gross blood, no melena  Musculoskeletal: Normal range of motion. She exhibits no edema or tenderness.  Neurological: She is alert and oriented to person, place, and time. No cranial nerve deficit. She exhibits normal muscle tone. Coordination normal.  No ataxia on finger to nose bilaterally. No pronator drift. 5/5 strength throughout. CN 2-12 intact.Equal grip strength. Sensation intact.   Skin: Skin is warm.  Psychiatric: She has a normal mood and affect. Her behavior is normal.  Nursing note and vitals reviewed.    ED Treatments / Results  Labs (all labs ordered are listed, but only abnormal results are displayed) Labs Reviewed  BASIC METABOLIC PANEL - Abnormal; Notable for the following:       Result Value   Sodium 129 (*)    CO2 20 (*)    Glucose, Bld 428 (*)    Calcium 8.4 (*)    All other components within normal limits  HEPATIC FUNCTION PANEL - Abnormal; Notable for the following:    Total Bilirubin 1.8 (*)    Indirect Bilirubin 1.5 (*)    All other components within normal limits  PROTIME-INR - Abnormal; Notable for the following:    Prothrombin Time 16.0 (*)    All other components within normal limits  RETICULOCYTES - Abnormal; Notable for the following:    Retic Ct Pct 17.2 (*)    RBC. 1.45 (*)    Retic Count, Manual 249.4 (*)    All other components within normal limits  VITAMIN B12  FOLATE  IRON AND TIBC  FERRITIN  POC OCCULT BLOOD, ED  TYPE AND SCREEN  PREPARE RBC (CROSSMATCH)    EKG  EKG Interpretation  Date/Time:  Monday January 08 2016 16:07:38 EDT Ventricular Rate:  89 PR  Interval:    QRS Duration: 98 QT Interval:  375 QTC Calculation: 457 R Axis:   38 Text Interpretation:  Sinus rhythm Borderline short PR interval RSR' in V1 or V2, right VCD or RVH No previous ECGs available Confirmed by Wyvonnia Dusky  MD, Jayln Branscom [phone]) on 01/08/2016 4:25:24 PM       Radiology No results found.  Procedures Procedures (including critical care time)  Medications Ordered in ED Medications  insulin aspart (novoLOG) injection 8 Units (not administered)  sodium chloride 0.9 % bolus 1,000 mL (not administered)     Initial Impression / Assessment and Plan / ED Course  I have reviewed the triage vital signs and the nursing notes.  Pertinent labs & imaging results that were available during my  care of the patient were reviewed by me and considered in my medical decision making (see chart for details).  Clinical Course  Comment By Time  Anemia with mildly elevated bilirubin. Hyperglycemia without DKA Ezequiel Essex, MD 07/24 1548   Sent from oncology clinic with anemia. Continues to have low blood count despite high-dose prednisone. Patient agreeable to blood transfusion.  Patient will be transfused 2 units of packed red blood cells. She is agreeable. Labs show hyperglycemia without DKA likely secondary to steroid use. Plan admission for blood transfusion and further workup of anemia. Discussed with Dr. Maudie Mercury.  CRITICAL CARE Performed by: Ezequiel Essex Total critical care time: 30 minutes Critical care time was exclusive of separately billable procedures and treating other patients. Critical care was necessary to treat or prevent imminent or life-threatening deterioration. Critical care was time spent personally by me on the following activities: development of treatment plan with patient and/or surrogate as well as nursing, discussions with consultants, evaluation of patient's response to treatment, examination of patient, obtaining history from patient or surrogate,  ordering and performing treatments and interventions, ordering and review of laboratory studies, ordering and review of radiographic studies, pulse oximetry and re-evaluation of patient's condition.  Final Clinical Impressions(s) / ED Diagnoses   Final diagnoses:  Symptomatic anemia  Hyperglycemia    New Prescriptions New Prescriptions   No medications on file     Ezequiel Essex, MD 01/09/16 [phone]

## 2016-01-08 NOTE — ED Notes (Signed)
Per lab. Pt has antibodies in blood and will be a while before it is ready. Pt and edp aware. Nad.

## 2016-01-08 NOTE — H&P (Signed)
                                                                                                         TRH H&P   Patient Demographics:    Hannah Dickson, is a 49 y.o. female  MRN: [id number]   DOB - 11/06/1965  Admit Date - 01/08/2016  Outpatient Primary MD for the patient is KARAM,PHILLIP JEROME, MD  Referring MD/NP/PA:  Steven Rancour  Outpatient Specialists:  Tom Kefalas / Penland  Patient coming from: home  Chief Complaint  Patient presents with  . Abnormal Lab      HPI:    Hannah Dickson  is a 49 y.o. female, w Anemia was apparently in hematology clinic for evaluation and hfound to have hemoglobin of 4. She had a transfusion in May and was thought to have hemolytic anemia and was transfused in [place]. She was seen by Dr. Penland who is concerned for hemolytic anemia. She's been 100 mg of prednisone daily without success. She had a bone marrow biopsy on July 21 of the results are not known. Patient denies any chest pain or shortness of breath. She does have dyspnea with exertion and some dizziness and lightheadedness. Denies any nausea or vomiting. Denies any vomiting or blood in the stool. ED d/w  Tom Kefalas PAC who feels this is not hemolytic anemia as it is not improving on prednisone.  He recommends admission for transfusion and further workup of autoimmune diseases.    Review of systems:    In addition to the HPI above,  No Fever-chills, No Headache, No changes with Vision or hearing, No problems swallowing food or Liquids, No Chest pain, Cough or Shortness of Breath, No Abdominal pain, No Nausea or Vommitting, Bowel movements are regular, No Blood in stool or Urine, No dysuria, No new skin rashes or bruises, No new joints pains-aches,  No new weakness, tingling, numbness in any extremity, No recent weight gain or loss, No polyuria, polydypsia or polyphagia, No significant Mental Stressors.  A full 10 point Review of Systems was done,  except as stated above, all other Review of Systems were negative.   With Past History of the following :    Past Medical History:  Diagnosis Date  . Allergy   . Anemia   . Arthritis   . Diabetes mellitus without complication (HCC)   . Hypertension   . Shortness of breath dyspnea    slight according to patient due to anemia  . Visual changes       Past Surgical History:  Procedure Laterality Date  . ANKLE SURGERY Left 2006  . CESAREAN SECTION        Social History:     Social History  Substance Use Topics  . Smoking status: Never Smoker  . Smokeless tobacco: Never Used  . Alcohol use No     Lives - at home  Mobility -      Family History :     Family History  Problem Relation Age of Onset  . Diabetes Mother   .   Cancer Father       Home Medications:   Prior to Admission medications   Medication Sig Start Date End Date Taking? Authorizing Provider  ibuprofen (ADVIL,MOTRIN) 800 MG tablet Take 800 mg by mouth daily as needed for moderate pain.    Yes Historical Provider, MD  lisinopril (PRINIVIL,ZESTRIL) 2.5 MG tablet Take 2.5 mg by mouth at bedtime.    Yes Historical Provider, MD  metFORMIN (GLUCOPHAGE) 850 MG tablet Take 850 mg by mouth 2 (two) times daily with a meal.   Yes Historical Provider, MD  predniSONE (DELTASONE) 20 MG tablet Take 3 tablets (60 mg total) by mouth daily with breakfast. Patient taking differently: Take 20 mg by mouth 2 (two) times daily with a meal.  01/05/16  Yes Shannon K Penland, MD     Allergies:    No Known Allergies   Physical Exam:   Vitals  Blood pressure 115/64, pulse 93, temperature 97.9 F (36.6 C), resp. rate 16, height 4' 6" (1.372 m), weight 61.7 kg (136 lb), SpO2 100 %.   1. General lying in bed in NAD,    2. Normal affect and insight, Not Suicidal or Homicidal, Awake Alert, Oriented X 3.  3. No F.N deficits, ALL C.Nerves Intact, Strength 5/5 all 4 extremities, Sensation intact all 4 extremities,  Plantars down going.  4. Ears and Eyes appear Normal, Pale Conjunctivae, PERRLA. Moist Oral Mucosa.  5. Supple Neck, No JVD, No cervical lymphadenopathy appriciated, No Carotid Bruits.  6. Symmetrical Chest wall movement, Good air movement bilaterally, CTAB.  7. RRR, No Gallops, Rubs or Murmurs, No Parasternal Heave.  8. Positive Bowel Sounds, Abdomen Soft, No tenderness, No organomegaly appriciated,No rebound -guarding or rigidity.  9.  No Cyanosis, Normal Skin Turgor, No Skin Rash or Bruise.  10. Good muscle tone,  joints appear normal , no effusions, Normal ROM.  11. No Palpable Lymph Nodes in Neck or Axillae     Data Review:    CBC  Recent Labs Lab 01/05/16 0915 01/05/16 1456 01/08/16 1251  WBC 1.5* 1.3* 5.2  HGB 5.5* 5.2* 4.8*  HCT 16.2* 15.5* 14.8*  PLT 85* 83* 88*  MCV 94.2 94.5 102.8*  MCH 32.0 31.7 33.3  MCHC 34.0 33.5 32.4  RDW 16.9* 17.1* 22.5*  LYMPHSABS  --  0.5* 0.8  MONOABS  --  0.3 0.7  EOSABS  --  0.0 0.0  BASOSABS  --  0.0 0.1   ------------------------------------------------------------------------------------------------------------------  Chemistries   Recent Labs Lab 01/08/16 1253 01/08/16 1456  NA 129*  --   K 4.9  --   CL 104  --   CO2 20*  --   GLUCOSE 428*  --   BUN 16  --   CREATININE 0.96  --   CALCIUM 8.4*  --   AST  --  24  ALT  --  24  ALKPHOS  --  88  BILITOT  --  1.8*   ------------------------------------------------------------------------------------------------------------------ estimated creatinine clearance is 48.9 mL/min (by C-G formula based on SCr of 0.96 mg/dL). ------------------------------------------------------------------------------------------------------------------ No results for input(s): TSH, T4TOTAL, T3FREE, THYROIDAB in the last 72 hours.  Invalid input(s): FREET3  Coagulation profile  Recent Labs Lab 01/05/16 0915 01/08/16 1456  INR 1.28 1.27    ------------------------------------------------------------------------------------------------------------------- No results for input(s): DDIMER in the last 72 hours. -------------------------------------------------------------------------------------------------------------------  Cardiac Enzymes No results for input(s): CKMB, TROPONINI, MYOGLOBIN in the last 168 hours.  Invalid input(s): CK ------------------------------------------------------------------------------------------------------------------ No results found for: BNP   ---------------------------------------------------------------------------------------------------------------  Urinalysis No   results found for: COLORURINE, APPEARANCEUR, LABSPEC, PHURINE, GLUCOSEU, HGBUR, BILIRUBINUR, KETONESUR, PROTEINUR, UROBILINOGEN, NITRITE, LEUKOCYTESUR  ----------------------------------------------------------------------------------------------------------------   Imaging Results:    No results found.     Assessment & Plan:    Principal Problem:   Anemia Active Problems:   Diabetes mellitus, type 2 (HCC)    1. Anemia Uncertain etiology, hx of elevation in sed rate 102 Check spep, upep check celiac panel, check Ldh Awaiting bone marrow biopsy results Macrocytosis,  Consider Jak Consult GI r/o GI blood loss.  Type and screen,  Transfuse 2 units prbc  2. Dm2 fsbs ac and qhs, iss  3. Sed rate elevation, (no headache present) Spep, upep Continue prednisone for now  4. Myalgia Check cpk Consider PMR, consider rheumatology consultation  DVT Prophylaxis  SCDs   AM Labs Ordered, also please review Full Orders  Family Communication: Admission, patients condition and plan of care including tests being ordered have been discussed with the patient  who indicate understanding and agree with the plan and Code Status.  Code Status FULL CODE  Likely DC to  home  Condition GUARDED    Consults called:  hematology/oncology   Admission status: observation  Time spent in minutes : 45 minutes   James Kim M.D on 01/08/2016 at 5:56 PM  Between 7am to 7pm - Pager - [phone]. After 7pm go to [email or web address] - password [email or web address]  [facility] - Office  [phone]    

## 2016-01-08 NOTE — ED Notes (Signed)
Floor unable to take report at this time.

## 2016-01-09 DIAGNOSIS — D599 Acquired hemolytic anemia, unspecified: Secondary | ICD-10-CM

## 2016-01-09 DIAGNOSIS — E871 Hypo-osmolality and hyponatremia: Secondary | ICD-10-CM | POA: Diagnosis not present

## 2016-01-09 DIAGNOSIS — D72819 Decreased white blood cell count, unspecified: Secondary | ICD-10-CM | POA: Diagnosis present

## 2016-01-09 DIAGNOSIS — D591 Other autoimmune hemolytic anemias: Secondary | ICD-10-CM

## 2016-01-09 DIAGNOSIS — D696 Thrombocytopenia, unspecified: Secondary | ICD-10-CM | POA: Diagnosis present

## 2016-01-09 DIAGNOSIS — D649 Anemia, unspecified: Secondary | ICD-10-CM | POA: Diagnosis present

## 2016-01-09 DIAGNOSIS — I959 Hypotension, unspecified: Secondary | ICD-10-CM | POA: Diagnosis present

## 2016-01-09 DIAGNOSIS — D5911 Warm autoimmune hemolytic anemia: Secondary | ICD-10-CM | POA: Diagnosis present

## 2016-01-09 DIAGNOSIS — I9589 Other hypotension: Secondary | ICD-10-CM | POA: Diagnosis not present

## 2016-01-09 DIAGNOSIS — R509 Fever, unspecified: Secondary | ICD-10-CM

## 2016-01-09 LAB — CBC
HEMATOCRIT: 13.2 % — AB (ref 36.0–46.0)
HEMATOCRIT: 24.1 % — AB (ref 36.0–46.0)
HEMOGLOBIN: 4.1 g/dL — AB (ref 12.0–15.0)
HEMOGLOBIN: 7.9 g/dL — AB (ref 12.0–15.0)
MCH: 32.5 pg (ref 26.0–34.0)
MCH: 29.9 pg (ref 26.0–34.0)
MCHC: 31.1 g/dL (ref 30.0–36.0)
MCHC: 32.8 g/dL (ref 30.0–36.0)
MCV: 104.8 fL — ABNORMAL HIGH (ref 78.0–100.0)
MCV: 91.3 fL (ref 78.0–100.0)
Platelets: 84 10*3/uL — ABNORMAL LOW (ref 150–400)
Platelets: 79 10*3/uL — ABNORMAL LOW (ref 150–400)
RBC: 1.26 MIL/uL — AB (ref 3.87–5.11)
RBC: 2.64 MIL/uL — ABNORMAL LOW (ref 3.87–5.11)
RDW: 23.1 % — ABNORMAL HIGH (ref 11.5–15.5)
RDW: 23.5 % — AB (ref 11.5–15.5)
WBC: 3.1 10*3/uL — AB (ref 4.0–10.5)
WBC: 5.3 10*3/uL (ref 4.0–10.5)

## 2016-01-09 LAB — CK TOTAL AND CKMB (NOT AT ARMC)
CK TOTAL: 7 U/L — AB (ref 38–234)
CK, MB: 0.4 ng/mL — AB (ref 0.5–5.0)
RELATIVE INDEX: INVALID (ref 0.0–2.5)

## 2016-01-09 LAB — COMPREHENSIVE METABOLIC PANEL
ALBUMIN: 3 g/dL — AB (ref 3.5–5.0)
ALK PHOS: 76 U/L (ref 38–126)
ALT: 21 U/L (ref 14–54)
ANION GAP: 6 (ref 5–15)
AST: 20 U/L (ref 15–41)
BILIRUBIN TOTAL: 1.6 mg/dL — AB (ref 0.3–1.2)
BUN: 12 mg/dL (ref 6–20)
CALCIUM: 8 mg/dL — AB (ref 8.9–10.3)
CO2: 22 mmol/L (ref 22–32)
Chloride: 109 mmol/L (ref 101–111)
Creatinine, Ser: 0.56 mg/dL (ref 0.44–1.00)
GLUCOSE: 220 mg/dL — AB (ref 65–99)
POTASSIUM: 3.4 mmol/L — AB (ref 3.5–5.1)
Sodium: 137 mmol/L (ref 135–145)
TOTAL PROTEIN: 5.6 g/dL — AB (ref 6.5–8.1)

## 2016-01-09 LAB — PROTEIN ELECTROPHORESIS, SERUM
A/G Ratio: 1 (ref 0.7–1.7)
ALPHA-1-GLOBULIN: 0.4 g/dL (ref 0.0–0.4)
ALPHA-2-GLOBULIN: 0.6 g/dL (ref 0.4–1.0)
Albumin ELP: 3.2 g/dL (ref 2.9–4.4)
BETA GLOBULIN: 1 g/dL (ref 0.7–1.3)
GAMMA GLOBULIN: 1.1 g/dL (ref 0.4–1.8)
Globulin, Total: 3.1 g/dL (ref 2.2–3.9)
TOTAL PROTEIN ELP: 6.3 g/dL (ref 6.0–8.5)

## 2016-01-09 LAB — HEMOGLOBIN A1C
Hgb A1c MFr Bld: 7.5 % — ABNORMAL HIGH (ref 4.8–5.6)
Mean Plasma Glucose: 169 mg/dL

## 2016-01-09 LAB — GLUCOSE, CAPILLARY
GLUCOSE-CAPILLARY: 160 mg/dL — AB (ref 65–99)
Glucose-Capillary: 267 mg/dL — ABNORMAL HIGH (ref 65–99)
Glucose-Capillary: 383 mg/dL — ABNORMAL HIGH (ref 65–99)
Glucose-Capillary: 362 mg/dL — ABNORMAL HIGH (ref 65–99)

## 2016-01-09 LAB — TSH: TSH: 0.547 u[IU]/mL (ref 0.350–4.500)

## 2016-01-09 LAB — T4, FREE: FREE T4: 1.06 ng/dL (ref 0.61–1.12)

## 2016-01-09 MED ORDER — METHYLPREDNISOLONE SODIUM SUCC 125 MG IJ SOLR
80.0000 mg | Freq: Once | INTRAMUSCULAR | Status: AC
  Administered 2016-01-09: 80 mg via INTRAVENOUS
  Filled 2016-01-09: qty 2

## 2016-01-09 MED ORDER — POTASSIUM CHLORIDE IN NACL 20-0.9 MEQ/L-% IV SOLN
INTRAVENOUS | Status: DC
  Administered 2016-01-09 – 2016-01-10 (×2): via INTRAVENOUS

## 2016-01-09 MED ORDER — INSULIN GLARGINE 100 UNIT/ML ~~LOC~~ SOLN
7.0000 [IU] | Freq: Every day | SUBCUTANEOUS | Status: DC
  Administered 2016-01-09: 7 [IU] via SUBCUTANEOUS
  Filled 2016-01-09 (×2): qty 0.07

## 2016-01-09 MED ORDER — POTASSIUM CHLORIDE CRYS ER 20 MEQ PO TBCR
20.0000 meq | EXTENDED_RELEASE_TABLET | Freq: Two times a day (BID) | ORAL | Status: DC
  Administered 2016-01-09 – 2016-01-10 (×3): 20 meq via ORAL
  Filled 2016-01-09 (×3): qty 1

## 2016-01-09 NOTE — Progress Notes (Signed)
Spanish interpreter line called and had explained to patient and family content of blood consent. Also patient was informed via interpreter about possible allergic reaction that may occur, signs and symptoms of reaction, and the procedure of the transfusion itself. It was explained that she will be closely monitored during transfusion, and that RN will be staying with her for first 15 minutes. She will be also pre medicated with Tylenol and Benadryl. She was also advised that this morning Dr. Sherrie Mustache will be consulting with Dr. Galen Manila and transfusion will only take place once they have consulted and made decisions. It was also explained to her that due to the nature of her specific type of blood, the blood was brought in from another site, and was carefully matched to come as close as possible for what she needs according to lab. However, there is I chance reaction may occur. Patient agreed and consent for blood transfusion had been signed. Marybeth RN has taken report and was informed of all of the above described situation. She verbalized understanding and will be waiting to hear back from Dr. Sherrie Mustache as to go ahead with the transfusion or to wait.

## 2016-01-09 NOTE — Progress Notes (Addendum)
Patient's chart is reviewed.  When I learned of HGB yesterday, patient was advised to report to ED for transfusional needs.  She did present to ED as directed.  Her case was discussed with ED physician so he was aware of this patient's arrival.  Summary:  Patient transitioned her hematology care to [facility] from Dr. Ubaldo Glassing [facility]) with the diagnosis of autoimmune hemolytic anemia.  This may be accurate, but does not explain her leukopenia and neutropenia.  She was therefore set-up for a bone marrow aspiration and biopsy performed on 7/21.  She was started on high dose prednisone with an improvement in WBC and neutropenia, but no improvement in HGB (as would be expected with hemolytic anemia).  She is now in the hospital for transfusion.  Dr. Laureen Ochs, pathologist called today with biopsy results.  He notes that her bone marrow is hypercellular with some dyspoetic changes in erythroid precursors without specific changes.  He denies any increased blasts.  He notes that findings are nonspecific and can be found in autoimmune diseases, medications, etc.  He will send for [facility].  Given her presentation and changes in blood counts, I have called Miachel Roux, MD [facility].  He agrees that the patient would benefit from a transfer to [facility] for work-up of benign heme as an inpatient.  He reports that the hospital is full at this time.  My pager number is provided and once called, I will provide the contact information of the attending hospitalist.  At this time, recommend Prednisone 100 mg PO daily and supportive care.  She does not have a primary bone marrow issue and needs further work-up for benign heme issue including work-up for autoimmune disease(s) that has not yet been identified.  Dellis Anes, PA-C 01/09/2016 7:26 PM  Addendum: Patient is seen with family at the bedside.  Her son translates.  She admits to intermittent fevers at home that has been managed with Ibuprofen at home.  She  denies any joint pain.  She denies any abdominal pain and headache.  She feels "great."  I personally reviewed and went over laboratory results with the patient.  The results are noted within this dictation.  I personally reviewed and went over pathology results with the patient.  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 - SEE COMMENT. PERIPHERAL BLOOD: - PANCYTOPENIA WITH NEUTROPHILIC LEFT SHIFT. 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 No ring sideroblasts or increase in blastic cells is identified. The features are not considered specific and the differential diagnosis includes secondary changes related to autoimmune disease, infection, medication, alcohol, etc or the findings may represent a primary myelodysplastic state. Correlation with cytogenetic and FISH studies is recommended. (BNS:gt, Jan 17, 2016) Guerry Bruin MD Pathologist, Electronic Signature (Case signed 01/09/2016)  Interpretation Bone Marrow Flow Cytometry - NO SIGNIFICANT BLASTIC POPULATION IDENTIFIED. Guerry Bruin MD Pathologist, Electronic Signature (Case signed 01/09/2016)   The patient is educated that her counts are not improving with steroids.  She is also advised of antibodies in blood making transfusions here difficult.    The patient is advised that we have discussed her case with [facility] hematology.  She is advised that after discussion with [facility], it is recommended that her care be transferred for further work-up given her significant and dramatic change in her HGB and abnormal WBC and platelet count (the latter two cannot  be explained with hemolytic anemia).  Recommendations while at [facility] are noted above.  She is advised that following evaluation and  management at [facility], we would be happy to be her local support.  She looks very well on exam without any acute findings.  All questions are answered.  KEFALAS,THOMAS, PA-C 01/09/2016 7:26 PM  As above. Patient I suspect may have an underlying autoimmune disease which may explain her pancytopenia. She does not have a "clean" hemolytic anemia. BMBX as above. She is not responding to high dose prednisone as I would suspect at this point. Have requested transfer to [facility].  I discussed with the patient and her family and they are in agreement.  On PE she does not appear acutely ill. She denies SOB, CP. HEENT: benign, Cardio: S1/S2 audible/regular PULM: CTAB Abd: soft, NT no HSM  Patient is currently receiving PRBC. Transfer to [facility] once bed available. Chiquita Loth MD

## 2016-01-09 NOTE — Progress Notes (Signed)
Late entry:  Patient has 2nd unit blood ordered.  Pre-medications given prior to first unit.  Dr. Sherrie Mustache on unit and asked about pre-medications for 2nd unit.  No pre-medication orders given for second unit.

## 2016-01-09 NOTE — Progress Notes (Signed)
Message sent to Dr. Sherrie Mustache that was given to Tyler Deis RN from Lab personnel. She asked to let Dr. Sherrie Mustache know, that if she has any question, to call [facility] pathologist @ [facility] hospital @ [phone]  who is aware of the emergency release of the blood, and is able to answer questions in regards to the transfusion. Message has been relayed to Dr. Sherrie Mustache via [facility] messaging system.

## 2016-01-09 NOTE — Progress Notes (Signed)
Call received from lab, reporting that patient will need a special blood that would need to be coming from somewhere else. Lab personnel also asked for attending physician's contact #, so he can notify the physician. Provided him with Dr. Selena Batten and Dr. Marigene Ehlers contact information. He stated he will call nurse once the blood is at [facility]. Asked patient if she ever received blood transfusion before. She stated she did in May of 2017 at [facility] in [place]. She said at that time they had to bring blood from [place]. Shared this information with Lab personnel.

## 2016-01-09 NOTE — Progress Notes (Signed)
PROGRESS NOTE    Hannah Dickson  HBZ:[id number] DOB: 04-30-1966 DOA: 01/08/2016 PCP: [name], MD Hematology/oncology Dr. Whitney Muse   Brief Narrative:  Patient is a 50 year old woman with a history of diabetes and recent diagnosis of profound anemia of etiology. She was recently referred to Dr. Whitney Muse for evaluation of profound anemia. The patient had been transfused 2 units of packed red blood cells at [facility] in May 2017. She presented to the ED on 01/08/2016 after Heme/Onc Dr. Whitney Muse did blood work in the clinic and found her hemoglobin to be 5.2. She was admitted for further evaluation and management.   Assessment & Plan:   Principal Problem:   Anemia Active Problems:   Warm reactive antibody (HCC)   Hyperbilirubinemia   Thrombocytopenia (HCC)   Leukopenia   Diabetes mellitus, type 2 (HCC)   Hypotension   Hyponatremia   1. Anemia of unknown etiology. The patient's hemoglobin was 4.8 on admission. Packed red blood cell transfusions were ordered shortly thereafter, but apparently she has antibodies. Discussed transfusion with pathologist, Dr. Saralyn Pilar. He stated that the patient had warm antibodies in her blood, but he had a good antigen profile. The blood was matched as closely as it could based on the antigen profile. Therefore, she will be transfused with pretreatment of Solu-Medrol, Tylenol, and Benadryl empirically. -Patient was started on prednisone by Dr. Whitney Muse a week or so ago for presumed hemolytic anemia. A number of outpatient studies were ordered 2 weeks ago and her haptoglobin was found to be less than 10, but no evidence of B12/folate/iron deficiency. Per PA, Mr. Sheldon Silvan, the patient's other blood counts have improved, but her hemoglobin has not, which has led them to believe that she does not have hemolytic anemia. -Bone marrow biopsy was done on 01/05/16 by IR at [facility], but the results are pending. -Noted mild hyperlipidemia. -A number of  other studies were ordered by the admitting physician, now pending. -HIV was negative in 2016, but will order another HIV along with an acute viral hepatitis panel. Will order TSH. -We'll continue prednisone. -Dr. Whitney Muse has been consulted.  Pancytopenia. Patient's white blood cell count was 1.5, hemoglobin 5.5, and platelet count 85 four days ago. Hemoglobin improved to 5.2 on admission, but it is 3.1 this morning. Platelet count is stable at 84. -Bone marrow biopsy results pending, as etiology is unknown at this time. -Patient has no signs of bleeding. She is menopausal and has not had a menstrual period in over 8 months.  Hypotension; history of hypertension. Patient's blood pressure is in the 10F systolically. The patient is asymptomatic. She was given lisinopril overnight for treatment of stage I hypertension. -Based on her hyponatremia on admission, her hypotension is likely from hypovolemia. She is not toxic appearing and doubt sepsis. -We will stop lisinopril and restart IV fluids. Packed red blood cell transfusions are pending which will help.  Hyponatremia. Patient's serum sodium was 129 on admission. She was started on normal saline. Her serum sodium has improved/normalized. Will continue gentle IV fluids.  Mild hypokalemia. Will start potassium chloride supplementation.  Type 2 diabetes mellitus. Patient is treated chronically with metformin. Due to recent start of prednisone, her blood glucose has been uncontrolled. Her hemoglobin A1c is 7.5. -We'll continue metformin. Sliding scale NovoLog was added for better control. Will add small dose of bedtime Lantus.   DVT prophylaxis: SCDs Code Status: Full code Family Communication: Discussed with children Disposition Plan: Plan to discharge to home, date unknown.   Consultants:  Hematology/oncology  Procedures:   01/09/16    Blood cell transfusions pending  Antimicrobials:  None   Subjective: (Patient's son  Hannah Dickson is assisting with translation/interpretation per request of the patient). Patient has a slight frontal headache, but no complaints of chest pain or shortness of breath at rest. No numbness or tingling or visual changes.  Objective: Vitals:   01/08/16 1745 01/08/16 2125 01/09/16 0544 01/09/16 0917  BP: 115/64 109/64 (!) 88/43 (!) 87/47  Pulse: 93 97 89 96  Resp: '16 20 20 20  '$ Temp: 97.9 F (36.6 C) 98.3 F (36.8 C) 98.2 F (36.8 C) 98.1 F (36.7 C)  TempSrc:  Oral Oral   SpO2: 100% 99% 100% 100%  Weight: 61.7 kg (136 lb)  61.6 kg (135 lb 14.4 oz)   Height: '4\' 6"'$  (1.372 m)  '4\' 6"'$  (1.372 m)     Intake/Output Summary (Last 24 hours) at 01/09/16 1017 Last data filed at 01/08/16 2124  Gross per 24 hour  Intake                0 ml  Output              900 ml  Net             -900 ml   Filed Weights   01/08/16 1414 01/08/16 1745 01/09/16 0544  Weight: 61.7 kg (136 lb) 61.7 kg (136 lb) 61.6 kg (135 lb 14.4 oz)    Examination:  General exam: Appears calm and comfortable  Oropharynx with mildly dry mucous membranes and pallor. Respiratory system: Clear to auscultation. Respiratory effort normal. Cardiovascular system: S1 & S2 heard with a soft systolic murmur. No JVD, murmurs, rubs, gallops or clicks. No pedal edema. Gastrointestinal system: Abdomen is nondistended, soft and nontender. No organomegaly or masses felt. Normal bowel sounds heard. Central nervous system: Alert and oriented. No focal neurological deficits. Extremities: Symmetric 5 x 5 power. Skin: No rashes, lesions or ulcers; no obvious petechiae Psychiatry: Judgement and insight appear normal. Mood & affect appropriate.     Data Reviewed: I have personally reviewed following labs and imaging studies  CBC:  Recent Labs Lab 01/05/16 0915 01/05/16 1456 01/08/16 1251 01/09/16 0425  WBC 1.5* 1.3* 5.2 3.1*  NEUTROABS  --  0.5* 3.6  --   HGB 5.5* 5.2* 4.8* 4.1*  HCT 16.2* 15.5* 14.8* 13.2*  MCV 94.2 94.5  102.8* 104.8*  PLT 85* 83* 88* 84*   Basic Metabolic Panel:  Recent Labs Lab 01/08/16 1253 01/09/16 0425  NA 129* 137  K 4.9 3.4*  CL 104 109  CO2 20* 22  GLUCOSE 428* 220*  BUN 16 12  CREATININE 0.96 0.56  CALCIUM 8.4* 8.0*   GFR: Estimated Creatinine Clearance: 58.7 mL/min (by C-G formula based on SCr of 0.8 mg/dL). Liver Function Tests:  Recent Labs Lab 01/08/16 1456 01/09/16 0425  AST 24 20  ALT 24 21  ALKPHOS 88 76  BILITOT 1.8* 1.6*  PROT 6.5 5.6*  ALBUMIN 3.6 3.0*   No results for input(s): LIPASE, AMYLASE in the last 168 hours. No results for input(s): AMMONIA in the last 168 hours. Coagulation Profile:  Recent Labs Lab 01/05/16 0915 01/08/16 1456 01/08/16 1842  INR 1.28 1.27 1.28   Cardiac Enzymes: No results for input(s): CKTOTAL, CKMB, CKMBINDEX, TROPONINI in the last 168 hours. BNP (last 3 results) No results for input(s): PROBNP in the last 8760 hours. HbA1C:  Recent Labs  01/08/16 1456  HGBA1C 7.5*  CBG:  Recent Labs Lab 01/05/16 0926 01/05/16 0930 01/08/16 2126 01/09/16 0804  GLUCAP 301* 298* 395* 160*   Lipid Profile: No results for input(s): CHOL, HDL, LDLCALC, TRIG, CHOLHDL, LDLDIRECT in the last 72 hours. Thyroid Function Tests: No results for input(s): TSH, T4TOTAL, FREET4, T3FREE, THYROIDAB in the last 72 hours. Anemia Panel:  Recent Labs  01/08/16 1456 01/08/16 1533  VITAMINB12 2,804*  --   FOLATE  --  22.8  FERRITIN 242  --   TIBC 287  --   IRON 215*  --   RETICCTPCT 17.2*  --    Sepsis Labs: No results for input(s): PROCALCITON, LATICACIDVEN in the last 168 hours.  No results found for this or any previous visit (from the past 240 hour(s)).       Radiology Studies: No results found.      Scheduled Meds: . sodium chloride   Intravenous Once  . acetaminophen  650 mg Oral Once  . diphenhydrAMINE  25 mg Oral Once  . insulin aspart  0-5 Units Subcutaneous QHS  . insulin aspart  0-9 Units  Subcutaneous TID WC  . metFORMIN  1,000 mg Oral BID WC  . methylPREDNISolone (SOLU-MEDROL) injection  80 mg Intravenous Once  . predniSONE  60 mg Oral Q breakfast  . sodium chloride flush  3 mL Intravenous Q12H   Continuous Infusions: . 0.9 % NaCl with KCl 20 mEq / L       LOS: 0 days    Time spent: 35-40 minutes    Rexene Alberts, MD [facility] Pager [phone]  If 7PM-7AM, please contact night-coverage [email or web address] Password [facility] 01/09/2016, 10:17 AM

## 2016-01-09 NOTE — Progress Notes (Signed)
I followed up with the patient to let her know that it was recommended that she be transferred to [facility] for further evaluation. Her brother was the interpreter. They were somewhat puzzled about why she needed to be transferred and wanted to speak with Dr. Galen Manila about this before agreeing to the transfer to [facility].

## 2016-01-09 NOTE — Progress Notes (Addendum)
RN received call from Peyton Najjar, Teaching laboratory technician at [facility].  [facility].  Receiving/admitting MD is Dr. Ardelle Park (hematology).  Unit phone number:  [phone]. Charge RN number at [facility] for unit:  [id number].  Dr. Sherrie Mustache notified via text page.

## 2016-01-10 DIAGNOSIS — I9589 Other hypotension: Secondary | ICD-10-CM | POA: Diagnosis not present

## 2016-01-10 DIAGNOSIS — D696 Thrombocytopenia, unspecified: Secondary | ICD-10-CM

## 2016-01-10 DIAGNOSIS — D72819 Decreased white blood cell count, unspecified: Secondary | ICD-10-CM

## 2016-01-10 DIAGNOSIS — E1165 Type 2 diabetes mellitus with hyperglycemia: Secondary | ICD-10-CM

## 2016-01-10 DIAGNOSIS — D649 Anemia, unspecified: Secondary | ICD-10-CM | POA: Diagnosis not present

## 2016-01-10 LAB — TYPE AND SCREEN
ABO/RH(D): O POS
ANTIBODY SCREEN: POSITIVE
DAT, IGG: POSITIVE
Unit division: 0
Unit division: 0
Unit division: 0
Unit division: 0

## 2016-01-10 LAB — BASIC METABOLIC PANEL
Anion gap: 5 (ref 5–15)
BUN: 12 mg/dL (ref 6–20)
CALCIUM: 8.2 mg/dL — AB (ref 8.9–10.3)
CO2: 22 mmol/L (ref 22–32)
Chloride: 109 mmol/L (ref 101–111)
Creatinine, Ser: 0.37 mg/dL — ABNORMAL LOW (ref 0.44–1.00)
GFR calc Af Amer: >60 mL/min (ref >60)
GLUCOSE: 152 mg/dL — AB (ref 65–99)
Potassium: 4 mmol/L (ref 3.5–5.1)
Sodium: 136 mmol/L (ref 135–145)

## 2016-01-10 LAB — RETICULIN ANTIBODIES, IGA W TITER: RETICULIN AB, IGA: NEGATIVE {titer}

## 2016-01-10 LAB — CBC
HCT: 23.4 % — ABNORMAL LOW (ref 36.0–46.0)
Hemoglobin: 7.5 g/dL — ABNORMAL LOW (ref 12.0–15.0)
MCH: 29.8 pg (ref 26.0–34.0)
MCHC: 32.1 g/dL (ref 30.0–36.0)
MCV: 92.9 fL (ref 78.0–100.0)
PLATELETS: 82 10*3/uL — AB (ref 150–400)
RBC: 2.52 MIL/uL — ABNORMAL LOW (ref 3.87–5.11)
RDW: 24.9 % — AB (ref 11.5–15.5)
WBC: 2.9 10*3/uL — ABNORMAL LOW (ref 4.0–10.5)

## 2016-01-10 LAB — GLIADIN ANTIBODIES, SERUM
GLIADIN IGG: 2 U (ref 0–19)
Gliadin IgA: 5 units (ref 0–19)

## 2016-01-10 LAB — GLUCOSE, CAPILLARY
GLUCOSE-CAPILLARY: 259 mg/dL — AB (ref 65–99)
Glucose-Capillary: 172 mg/dL — ABNORMAL HIGH (ref 65–99)
Glucose-Capillary: 388 mg/dL — ABNORMAL HIGH (ref 65–99)
Glucose-Capillary: 372 mg/dL — ABNORMAL HIGH (ref 65–99)

## 2016-01-10 LAB — TISSUE TRANSGLUTAMINASE, IGA

## 2016-01-10 MED ORDER — INSULIN ASPART 100 UNIT/ML ~~LOC~~ SOLN: 0.0000 [IU] | Freq: Three times a day (TID) | SUBCUTANEOUS | 11 refills | Status: DC

## 2016-01-10 MED ORDER — METFORMIN HCL 1000 MG PO TABS: 1000.0000 mg | ORAL_TABLET | Freq: Two times a day (BID) | ORAL | Status: AC

## 2016-01-10 MED ORDER — INSULIN GLARGINE 100 UNIT/ML ~~LOC~~ SOLN: 7.0000 [IU] | Freq: Every day | SUBCUTANEOUS | 11 refills | Status: DC

## 2016-01-10 MED ORDER — INSULIN ASPART 100 UNIT/ML ~~LOC~~ SOLN: 0.0000 [IU] | Freq: Every day | SUBCUTANEOUS | 11 refills | Status: DC

## 2016-01-10 MED ORDER — ACETAMINOPHEN 325 MG PO TABS: 650.0000 mg | ORAL_TABLET | Freq: Four times a day (QID) | ORAL | Status: AC | PRN

## 2016-01-10 MED ORDER — POTASSIUM CHLORIDE IN NACL 20-0.9 MEQ/L-% IV SOLN: 75.0000 mL/h | INTRAVENOUS | Status: DC

## 2016-01-10 NOTE — Discharge Summary (Signed)
Physician Discharge Summary  Charmel Pronovost EFE:[id number] DOB: August 19, 1965 DOA: 01/08/2016  PCP: [name], MD  Admit date: 01/08/2016 Discharge date: 01/10/2016  Admitted From: home Disposition: transfer to [facility]  Recommendations for Outpatient Follow-up:  1. Patient will be transferred to [facility] hospital for further management 2. Lisinopril discontinued due to borderline blood pressures. Restart as appropriate. 3. Ibuprofen discontinued due to severe anemia.   Discharge Condition:stable CODE STATUS: full code Diet recommendation: Heart Healthy / Carb Modified  Brief/Interim Summary: This is a 50 year old Spanish-speaking female with a history of hypertension, diabetes who was a basal hospital with profound anemia of unclear etiology. She was recently referred to Dr. Whitney Muse for evaluation of anemia. On 12/22/15 her hemoglobin was noted to be 11, WBC count of 0.7, platelets 161. When she presented to the hospital on 7/24, her hemoglobin was noted to be 4.8, platelets 88, WBC 5.2.  Patient was being followed as an outpatient with the [facility] for further evaluation of anemia. On 7/21 she was noted to have a hemoglobin of 5.5. It was felt that she may have a hemolytic anemia and she was started on steroids. Bone marrow biopsy was also completed which was found to be inconclusive. Her hemoglobin did not improve with steroids and she presented on 7/24 with a hemoglobin of 4.8. The patient was transferred to units of PRBCs. Obtaining blood for her is challenging due to multiple antibodies. After transfusion, her hemoglobin improved and she is feeling better. Oncology followed her in the hospital and discussed the case with Dr. Jerrye Noble at [facility]. He was recommended that patient be transferred to [facility] for further evaluation. She does not have any evidence of bleeding at this time. She did admit to intermittent fevers at home which  she had been managing with ibuprofen, but has not had any fevers in the hospital. The patient has been accepted to [facility] and will be transferred once a bed is available.  Discharge Diagnoses:  Principal Problem:   Anemia of unknown etiology Active Problems:   Type 2 diabetes mellitus, uncontrolled (HCC)   Warm reactive antibody (HCC)   Hypotension   Hyponatremia   Hyperbilirubinemia   Thrombocytopenia (HCC)   Leukopenia   Anemia    Discharge Instructions  Discharge Instructions    Diet - low sodium heart healthy    Complete by:  As directed   Increase activity slowly    Complete by:  As directed       Medication List    STOP taking these medications   ibuprofen 800 MG tablet Commonly known as:  ADVIL,MOTRIN   lisinopril 2.5 MG tablet Commonly known as:  PRINIVIL,ZESTRIL     TAKE these medications   0.9 % NaCl with KCl 20 mEq / L 20-0.9 MEQ/L-% Inject 75 mL/hr into the vein continuous.   acetaminophen 325 MG tablet Commonly known as:  TYLENOL Take 2 tablets (650 mg total) by mouth every 6 (six) hours as needed for mild pain (or Fever >/= 101).   insulin aspart 100 UNIT/ML injection Commonly known as:  novoLOG Inject 0-9 Units into the skin 3 (three) times daily with meals.   insulin aspart 100 UNIT/ML injection Commonly known as:  novoLOG Inject 0-5 Units into the skin at bedtime.   insulin glargine 100 UNIT/ML injection Commonly known as:  LANTUS Inject 0.07 mLs (7 Units total) into the skin at bedtime.   metFORMIN 1000 MG tablet Commonly known as:  GLUCOPHAGE Take  1 tablet (1,000 mg total) by mouth 2 (two) times daily with a meal. What changed:  Another medication with the same name was removed. Continue taking this medication, and follow the directions you see here.   predniSONE 20 MG tablet Commonly known as:  DELTASONE Take 3 tablets (60 mg total) by mouth daily with breakfast. What changed:  how much to take  when to take this        No Known Allergies  Consultations:  Oncology, Dr. Whitney Muse   Procedures/Studies: Ct Biopsy  Result Date: 01-20-16 INDICATION: 50 year old female with pancytopenia EXAM: [place] BIOPSY Interventional Radiologist:  Criselda Peaches, MD MEDICATIONS: None. ANESTHESIA/SEDATION: Moderate (conscious) sedation was employed during this procedure. A total of 2 milligrams versed and 100 micrograms fentanyl were administered intravenously. The patient's level of consciousness and vital signs were monitored continuously by radiology nursing throughout the procedure under my direct supervision. Total monitored sedation time: 10 minutes FLUOROSCOPY TIME:  None COMPLICATIONS: None immediate. Estimated blood loss: <25 mL PROCEDURE: Informed written consent was obtained from the patient after a thorough discussion of the procedural risks, benefits and alternatives. All questions were addressed. Maximal Sterile Barrier Technique was utilized including caps, mask, sterile gowns, sterile gloves, sterile drape, hand hygiene and skin antiseptic. A timeout was performed prior to the initiation of the procedure. The patient was positioned prone and non-contrast localization CT was performed of the pelvis to demonstrate the iliac marrow spaces. Maximal barrier sterile technique utilized including caps, mask, sterile gowns, sterile gloves, large sterile drape, hand hygiene, and betadine prep. Under sterile conditions and local anesthesia, an 11 gauge coaxial bone biopsy needle was advanced into the right iliac marrow space. Needle position was confirmed with CT imaging. Initially, bone marrow aspiration was performed. Next, the 11 gauge outer cannula was utilized to obtain a right iliac bone marrow core biopsy. Needle was removed. Hemostasis was obtained with compression. The patient tolerated the procedure well. Samples were prepared with the cytotechnologist. IMPRESSION: Technically  successful CT-guided bone marrow biopsy. Signed, Criselda Peaches, MD [facility] Radiology Electronically Signed   By: Jacqulynn Cadet M.D.   On: January 20, 2016 13:00       Subjective: Patient denies any complaints today. She reported some blurry vision yesterday, but says it has resolved today. No shortness of breath, weakness or dizziness  Discharge Exam: Vitals:   01/09/16 2254 01/10/16 0616  BP: (!) 100/49 96/67  Pulse: 74 82  Resp: 20 20  Temp: 98.3 F (36.8 C) 98 F (36.7 C)   Vitals:   01/09/16 1958 01/09/16 2254 01/10/16 0616 01/10/16 0659  BP: (!) 101/53 (!) 100/49 96/67   Pulse: 83 74 82   Resp: _0 Temp: 98.2 F (36.8 C) 98.3 F (36.8 C) 98 F (36.7 C)   TempSrc: Oral Oral Oral   SpO2: 100% 100% 98%   Weight:    62 kg (136 lb 9.6 oz)  Height:        General: Pt is alert, awake, not in acute distress Cardiovascular: RRR, S1/S2 +, no rubs, no gallops Respiratory: CTA bilaterally, no wheezing, no rhonchi Abdominal: Soft, NT, ND, bowel sounds + Extremities: no edema, no cyanosis    The results of significant diagnostics from this hospitalization (including imaging, microbiology, ancillary and laboratory) are listed below for reference.     Microbiology: No results found for this or any previous visit (from the past 240 hour(s)).  Labs: BNP (last 3 results) No results for input(s): BNP in the last 8760 hours. Basic Metabolic Panel:  Recent Labs Lab 01/08/16 1253 01/09/16 0425 01/10/16 0540  NA 129* 137 136  K 4.9 3.4* 4.0  CL 104 109 109  CO2 20* 22 22  GLUCOSE 428* 220* 152*  BUN _0 CREATININE 0.96 0.56 0.37*  CALCIUM 8.4* 8.0* 8.2*   Liver Function Tests:  Recent Labs Lab 01/08/16 1456 01/09/16 0425  AST 24 20  ALT 24 21  ALKPHOS 88 76  BILITOT 1.8* 1.6*  PROT 6.5 5.6*  ALBUMIN 3.6 3.0*   No results for input(s): LIPASE, AMYLASE in the last 168 hours. No results  for input(s): AMMONIA in the last 168 hours. CBC:  Recent Labs Lab 01/05/16 1456 01/08/16 1251 01/09/16 0425 01/09/16 2112 01/10/16 0540  WBC 1.3* 5.2 3.1* 5.3 2.9*  NEUTROABS 0.5* 3.6  --   --   --   HGB 5.2* 4.8* 4.1* 7.9* 7.5*  HCT 15.5* 14.8* 13.2* 24.1* 23.4*  MCV 94.5 102.8* 104.8* 91.3 92.9  PLT 83* 88* 84* 79* 82*   Cardiac Enzymes:  Recent Labs Lab 01/09/16 0425  CKTOTAL 7*  CKMB 0.4*   BNP: Invalid input(s): POCBNP CBG:  Recent Labs Lab 01/09/16 1122 01/09/16 1614 01/09/16 2102 01/10/16 0724 01/10/16 1132  GLUCAP 267* 383* 362* 172* 388*   D-Dimer No results for input(s): DDIMER in the last 72 hours. Hgb A1c  Recent Labs  01/08/16 1456  HGBA1C 7.5*   Lipid Profile No results for input(s): CHOL, HDL, LDLCALC, TRIG, CHOLHDL, LDLDIRECT in the last 72 hours. Thyroid function studies  Recent Labs  01/08/16 1842  TSH 0.547   Anemia work up  Recent Labs  01/08/16 1456 01/08/16 1533  VITAMINB12 2,804*  --   FOLATE  --  22.8  FERRITIN 242  --   TIBC 287  --   IRON 215*  --   RETICCTPCT 17.2*  --    Urinalysis No results found for: COLORURINE, APPEARANCEUR, LABSPEC, [place], GLUCOSEU, HGBUR, BILIRUBINUR, KETONESUR, PROTEINUR, UROBILINOGEN, NITRITE, LEUKOCYTESUR Sepsis Labs Invalid input(s): PROCALCITONIN,  WBC,  LACTICIDVEN Microbiology No results found for this or any previous visit (from the past 240 hour(s)).   Time coordinating discharge: Over 30 minutes  SIGNED:   Kathie Dike, MD  [facility] 01/10/2016, 4:32 PM   If 7PM-7AM, please contact night-coverage [email or web address] Password [email or web address]

## 2016-01-10 NOTE — Progress Notes (Signed)
Called [facility] to give report, room information updated to ROOM 632.  Report called to [facility], answered all questions at this time.  Awaiting [facility] arrival to transport pt to facility.

## 2016-01-10 NOTE — Progress Notes (Signed)
RN received call from Peyton Najjar, Teaching laboratory technician at [facility].  Patient has [facility] the [facility]. Unit phone number:  [phone]. Charge RN number at [facility] for unit:  [phone].

## 2016-01-11 LAB — HEPATITIS PANEL, ACUTE
HCV Ab: <0.1 s/co ratio (ref 0.0–0.9)
Hep A IgM: NEGATIVE
Hep B C IgM: NEGATIVE
Hepatitis B Surface Ag: NEGATIVE

## 2016-01-11 LAB — HIV ANTIBODY (ROUTINE TESTING W REFLEX): HIV SCREEN 4TH GENERATION: NONREACTIVE

## 2016-01-12 DIAGNOSIS — R768 Other specified abnormal immunological findings in serum: Secondary | ICD-10-CM | POA: Insufficient documentation

## 2016-01-14 ENCOUNTER — Encounter (HOSPITAL_COMMUNITY): Payer: Self-pay | Admitting: Hematology & Oncology

## 2016-01-15 LAB — TISSUE HYBRIDIZATION (BONE MARROW)-NCBH

## 2016-01-15 LAB — CHROMOSOME ANALYSIS, BONE MARROW

## 2016-01-17 LAB — UIFE/LIGHT CHAINS/TP QN, 24-HR UR
% BETA, URINE: 0 %
ALBUMIN, U: 100 %
ALPHA 1 URINE: 0 %
ALPHA 2 UR: 0 %
FREE LT CHN EXCR RATE: 34.5 mg/L — AB (ref 1.35–24.19)
Free Kappa/Lambda Ratio: 5.93 (ref 2.04–10.37)
Free Lambda Lt Chains,Ur: 5.82 mg/L (ref 0.24–6.66)
GAMMA GLOBULIN URINE: 0 %
Time: 24 hours
Total Protein, Urine-Ur/day: 557 mg/24 hr — ABNORMAL HIGH (ref 30–150)
Total Protein, Urine: 19.7 mg/dL
Volume, Urine: 2825 mL

## 2016-01-24 ENCOUNTER — Encounter (HOSPITAL_COMMUNITY): Payer: Self-pay

## 2016-01-29 ENCOUNTER — Other Ambulatory Visit (HOSPITAL_COMMUNITY): Payer: PRIVATE HEALTH INSURANCE

## 2016-01-29 ENCOUNTER — Encounter (HOSPITAL_COMMUNITY): Payer: Self-pay | Admitting: Oncology

## 2016-01-29 ENCOUNTER — Ambulatory Visit (HOSPITAL_COMMUNITY): Payer: PRIVATE HEALTH INSURANCE | Admitting: Oncology

## 2016-01-29 NOTE — Progress Notes (Deleted)
Hannah Blower, MD [place] [id number]  No diagnosis found.  CURRENT THERAPY: Prednisone taper beginning on 01/16/2016 at 40 mg x 4 days and reduction by 10 mg every 4 days by Dr. Chip Boer at [facility].  INTERVAL HISTORY: Hannah Dickson 50 y.o. 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   ROS  Past Medical History:  Diagnosis Date  . Allergy   . Anemia   . Arthritis   . Diabetes mellitus without complication ([place])   . Hypertension   . Shortness of breath dyspnea    slight according to patient due to anemia  . Visual changes     Past Surgical History:  Procedure Laterality Date  . ANKLE SURGERY Left 2006  . CESAREAN SECTION      Family History  Problem Relation Age of Onset  . Diabetes Mother   . Cancer Father     Social History   Social History  . Marital status: Married    Spouse name: N/A  . Number of children: N/A  . Years of education: N/A   Social History Main Topics  . Smoking status: Never Smoker  . Smokeless tobacco: Never Used  . Alcohol use No  . Drug use: No  . Sexual activity: No   Other Topics Concern  . Not on file   Social History Narrative  . No narrative on file     PHYSICAL EXAMINATION  ECOG PERFORMANCE STATUS: {CHL ONC ECOG PS:[phone]}  There were no vitals filed for this visit.  GENERAL:{CHL ONC PE GENERAL:[phone]} SKIN: {CHL ONC PE ASUO:[id number]} HEAD: {CHL ONC PE EXMD:4709295747} EYES: {CHL ONC PE BUYZ:7096438381} EARS: {CHL ONC PE MMCR:7543606770} OROPHARYNX:{CHL ONC PE OROPHARYNX:[phone]}  NECK: {CHL ONC PE HEKB:5248185909} LYMPH:  {CHL ONC PE PJPET:6244695072} BREAST:{CHL ONC PE BREAST:[phone]} LUNGS: {CHL ONC PE UVJDY:5183358251} HEART: {CHL ONC PE GFQMK:1031281188} ABDOMEN:{CHL ONC PE  ABDOMEN:[phone]} BACK: {CHL ONC PE QLRJ:7366815947} EXTREMITIES:{CHL ONC PE EXTREMITIES:[phone]}  NEURO: {CHL ONC PE [phone]} PELVIC:{CHL ONC PE [phone]} RECTAL: {CHL ONC PE RECTAL:[phone]}   LABORATORY DATA: CBC    Component Value Date/Time   WBC 2.9 (L) 01/10/2016 0540   RBC 2.52 (L) 01/10/2016 0540   HGB 7.5 (L) 01/10/2016 0540   HCT 23.4 (L) 01/10/2016 0540   PLT 82 (L) 01/10/2016 0540   MCV 92.9 01/10/2016 0540   MCH 29.8 01/10/2016 0540   MCHC 32.1 01/10/2016 0540   RDW 24.9 (H) 01/10/2016 0540   LYMPHSABS 0.8 01/08/2016 1251   MONOABS 0.7 01/08/2016 1251   EOSABS 0.0 01/08/2016 1251   BASOSABS 0.1 01/08/2016 1251      Chemistry      Component Value Date/Time   NA 136 01/10/2016 0540   K 4.0 01/10/2016 0540   CL 109 01/10/2016 0540   CO2 22 01/10/2016 0540   BUN 12 01/10/2016 0540   CREATININE 0.37 (L) 01/10/2016 0540      Component Value Date/Time   CALCIUM 8.2 (L) 01/10/2016 0540   ALKPHOS 76 01/09/2016 0425   AST 20 01/09/2016 0425   ALT 21 01/09/2016 0425   BILITOT 1.6 (H) 01/09/2016 0425        PENDING LABS:   RADIOGRAPHIC STUDIES:  Ct Biopsy  Result Date: 01/05/2016 INDICATION: 50 year old female with pancytopenia EXAM: CT GUIDED BONE MARROW ASPIRATION AND CORE BIOPSY Interventional Radiologist:  Criselda Peaches, MD MEDICATIONS:  None. ANESTHESIA/SEDATION: Moderate (conscious) sedation was employed during this procedure. A total of 2 milligrams versed and 100 micrograms fentanyl were administered intravenously. The patient's level of consciousness and vital signs were monitored continuously by radiology nursing throughout the procedure under my direct supervision. Total monitored sedation time: 10 minutes FLUOROSCOPY TIME:  None COMPLICATIONS: None immediate. Estimated blood loss: <25 mL PROCEDURE: Informed written consent was obtained from the patient after a thorough discussion of the procedural risks, benefits and  alternatives. All questions were addressed. Maximal Sterile Barrier Technique was utilized including caps, mask, sterile gowns, sterile gloves, sterile drape, hand hygiene and skin antiseptic. A timeout was performed prior to the initiation of the procedure. The patient was positioned prone and non-contrast localization CT was performed of the pelvis to demonstrate the iliac marrow spaces. Maximal barrier sterile technique utilized including caps, mask, sterile gowns, sterile gloves, large sterile drape, hand hygiene, and betadine prep. Under sterile conditions and local anesthesia, an 11 gauge coaxial bone biopsy needle was advanced into the right iliac marrow space. Needle position was confirmed with CT imaging. Initially, bone marrow aspiration was performed. Next, the 11 gauge outer cannula was utilized to obtain a right iliac bone marrow core biopsy. Needle was removed. Hemostasis was obtained with compression. The patient tolerated the procedure well. Samples were prepared with the cytotechnologist. IMPRESSION: Technically successful CT-guided bone marrow biopsy. Signed, Criselda Peaches, MD [facility] Radiology Electronically Signed   By: Jacqulynn Cadet M.D.   On: 01/05/2016 13:00     PATHOLOGY:    ASSESSMENT AND PLAN:  No problem-specific Assessment & Plan notes found for this encounter.   ORDERS PLACED FOR THIS ENCOUNTER: No orders of the defined types were placed in this encounter.   MEDICATIONS PRESCRIBED THIS ENCOUNTER: No orders of the defined types were placed in this encounter.   THERAPY PLAN:  ***  All questions were answered. The patient knows to call the clinic with any problems, questions or concerns. We can certainly see the patient much sooner if necessary.  Patient and plan discussed with Dr. Ancil Linsey and she is in agreement with the aforementioned.   This note is electronically signed by: Doy Mince 01/29/2016 9:02 AM

## 2016-01-29 NOTE — Assessment & Plan Note (Deleted)
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  [facility] due to progressive cytopenia without response to oral Prednisone.  She has been seen by [facility] Dorothy [facility]atharine Batt [facility] and is on a Prednisone taper beginning on 01/16/2016 at 40 mg x 4 days and reduction by 10 mg every 4 days

## 2016-04-02 ENCOUNTER — Other Ambulatory Visit (HOSPITAL_COMMUNITY): Payer: Self-pay | Admitting: *Deleted

## 2016-04-02 DIAGNOSIS — Z1231 Encounter for screening mammogram for malignant neoplasm of breast: Secondary | ICD-10-CM

## 2016-04-08 ENCOUNTER — Ambulatory Visit (HOSPITAL_COMMUNITY): Payer: Self-pay

## 2016-04-22 ENCOUNTER — Ambulatory Visit (HOSPITAL_COMMUNITY)
Admission: RE | Admit: 2016-04-22 | Discharge: 2016-04-22 | Disposition: A | Discharge status: Home / Self Care | Payer: Self-pay | Source: Ambulatory Visit | Attending: *Deleted | Admitting: *Deleted

## 2016-04-22 ENCOUNTER — Other Ambulatory Visit (HOSPITAL_COMMUNITY): Payer: Self-pay | Admitting: *Deleted

## 2016-04-22 DIAGNOSIS — Z1231 Encounter for screening mammogram for malignant neoplasm of breast: Secondary | ICD-10-CM

## 2017-08-19 ENCOUNTER — Other Ambulatory Visit (HOSPITAL_COMMUNITY): Payer: Self-pay | Admitting: *Deleted

## 2017-08-19 DIAGNOSIS — Z1231 Encounter for screening mammogram for malignant neoplasm of breast: Secondary | ICD-10-CM

## 2017-08-25 ENCOUNTER — Ambulatory Visit (HOSPITAL_COMMUNITY)
Admission: RE | Admit: 2017-08-25 | Discharge: 2017-08-25 | Disposition: A | Discharge status: Home / Self Care | Payer: PRIVATE HEALTH INSURANCE | Source: Ambulatory Visit | Attending: *Deleted | Admitting: *Deleted

## 2017-08-25 DIAGNOSIS — Z1231 Encounter for screening mammogram for malignant neoplasm of breast: Secondary | ICD-10-CM | POA: Diagnosis not present

## 2018-12-29 ENCOUNTER — Emergency Department (HOSPITAL_COMMUNITY): Payer: HRSA Program

## 2018-12-29 ENCOUNTER — Inpatient Hospital Stay (HOSPITAL_COMMUNITY)
Admission: EM | Admit: 2018-12-29 | Discharge: 2019-01-08 | DRG: 177 | Disposition: A | Discharge status: Home / Self Care | Payer: HRSA Program | Attending: Internal Medicine | Admitting: Internal Medicine

## 2018-12-29 ENCOUNTER — Other Ambulatory Visit: Payer: Self-pay

## 2018-12-29 ENCOUNTER — Encounter (HOSPITAL_COMMUNITY): Payer: Self-pay

## 2018-12-29 DIAGNOSIS — Z809 Family history of malignant neoplasm, unspecified: Secondary | ICD-10-CM

## 2018-12-29 DIAGNOSIS — D696 Thrombocytopenia, unspecified: Secondary | ICD-10-CM | POA: Diagnosis not present

## 2018-12-29 DIAGNOSIS — R112 Nausea with vomiting, unspecified: Secondary | ICD-10-CM | POA: Diagnosis present

## 2018-12-29 DIAGNOSIS — E871 Hypo-osmolality and hyponatremia: Secondary | ICD-10-CM | POA: Diagnosis present

## 2018-12-29 DIAGNOSIS — Z7984 Long term (current) use of oral hypoglycemic drugs: Secondary | ICD-10-CM | POA: Diagnosis not present

## 2018-12-29 DIAGNOSIS — E861 Hypovolemia: Secondary | ICD-10-CM | POA: Diagnosis present

## 2018-12-29 DIAGNOSIS — M1990 Unspecified osteoarthritis, unspecified site: Secondary | ICD-10-CM | POA: Diagnosis present

## 2018-12-29 DIAGNOSIS — Z833 Family history of diabetes mellitus: Secondary | ICD-10-CM

## 2018-12-29 DIAGNOSIS — E876 Hypokalemia: Secondary | ICD-10-CM | POA: Diagnosis not present

## 2018-12-29 DIAGNOSIS — E1165 Type 2 diabetes mellitus with hyperglycemia: Secondary | ICD-10-CM | POA: Diagnosis present

## 2018-12-29 DIAGNOSIS — D591 Autoimmune hemolytic anemia, unspecified: Secondary | ICD-10-CM

## 2018-12-29 DIAGNOSIS — D599 Acquired hemolytic anemia, unspecified: Secondary | ICD-10-CM | POA: Diagnosis not present

## 2018-12-29 DIAGNOSIS — E872 Acidosis: Secondary | ICD-10-CM | POA: Diagnosis present

## 2018-12-29 DIAGNOSIS — J1289 Other viral pneumonia: Secondary | ICD-10-CM | POA: Diagnosis present

## 2018-12-29 DIAGNOSIS — G9341 Metabolic encephalopathy: Secondary | ICD-10-CM | POA: Diagnosis present

## 2018-12-29 DIAGNOSIS — D5911 Warm autoimmune hemolytic anemia: Secondary | ICD-10-CM | POA: Diagnosis present

## 2018-12-29 DIAGNOSIS — R05 Cough: Secondary | ICD-10-CM

## 2018-12-29 DIAGNOSIS — R059 Cough, unspecified: Secondary | ICD-10-CM

## 2018-12-29 DIAGNOSIS — U071 COVID-19: Principal | ICD-10-CM | POA: Diagnosis present

## 2018-12-29 DIAGNOSIS — I10 Essential (primary) hypertension: Secondary | ICD-10-CM | POA: Diagnosis present

## 2018-12-29 DIAGNOSIS — IMO0002 Reserved for concepts with insufficient information to code with codable children: Secondary | ICD-10-CM | POA: Diagnosis present

## 2018-12-29 DIAGNOSIS — D649 Anemia, unspecified: Secondary | ICD-10-CM | POA: Diagnosis not present

## 2018-12-29 DIAGNOSIS — I959 Hypotension, unspecified: Secondary | ICD-10-CM | POA: Diagnosis present

## 2018-12-29 DIAGNOSIS — T380X5A Adverse effect of glucocorticoids and synthetic analogues, initial encounter: Secondary | ICD-10-CM | POA: Diagnosis not present

## 2018-12-29 DIAGNOSIS — Z23 Encounter for immunization: Secondary | ICD-10-CM

## 2018-12-29 HISTORY — DX: Acquired hemolytic anemia, unspecified: D59.9

## 2018-12-29 LAB — CBC WITH DIFFERENTIAL/PLATELET
Abs Immature Granulocytes: 0.45 10*3/uL — ABNORMAL HIGH (ref 0.00–0.07)
Basophils Absolute: 0 10*3/uL (ref 0.0–0.1)
Basophils Relative: 0 %
Eosinophils Absolute: 0 10*3/uL (ref 0.0–0.5)
Eosinophils Relative: 0 %
HCT: 9.5 % — ABNORMAL LOW (ref 36.0–46.0)
Hemoglobin: 3.2 g/dL — CL (ref 12.0–15.0)
Immature Granulocytes: 3 %
Lymphocytes Relative: 15 %
Lymphs Abs: 2.5 10*3/uL (ref 0.7–4.0)
MCH: 32.3 pg (ref 26.0–34.0)
MCHC: 33.7 g/dL (ref 30.0–36.0)
MCV: 96 fL (ref 80.0–100.0)
Monocytes Absolute: 1.1 10*3/uL — ABNORMAL HIGH (ref 0.1–1.0)
Monocytes Relative: 7 %
Neutro Abs: 12.5 10*3/uL — ABNORMAL HIGH (ref 1.7–7.7)
Neutrophils Relative %: 75 %
Platelets: 521 10*3/uL — ABNORMAL HIGH (ref 150–400)
RBC: 0.99 MIL/uL — ABNORMAL LOW (ref 3.87–5.11)
RDW: 14.9 % (ref 11.5–15.5)
WBC: 16.5 10*3/uL — ABNORMAL HIGH (ref 4.0–10.5)
nRBC: 0.4 % — ABNORMAL HIGH (ref 0.0–0.2)

## 2018-12-29 LAB — URINALYSIS, ROUTINE W REFLEX MICROSCOPIC
Bacteria, UA: NONE SEEN
Bilirubin Urine: NEGATIVE
Glucose, UA: >=500 mg/dL — AB
Hgb urine dipstick: NEGATIVE
Ketones, ur: 5 mg/dL — AB
Leukocytes,Ua: NEGATIVE
Nitrite: NEGATIVE
Protein, ur: NEGATIVE mg/dL
Specific Gravity, Urine: 1.016 (ref 1.005–1.030)
pH: 5 (ref 5.0–8.0)

## 2018-12-29 LAB — RETICULOCYTES
Immature Retic Fract: 41.6 % — ABNORMAL HIGH (ref 2.3–15.9)
RBC.: 1.02 MIL/uL — ABNORMAL LOW (ref 3.87–5.11)
Retic Count, Absolute: 44.2 10*3/uL (ref 19.0–186.0)
Retic Ct Pct: 4.3 % — ABNORMAL HIGH (ref 0.4–3.1)

## 2018-12-29 LAB — BLOOD GAS, VENOUS
Acid-base deficit: 3.6 mmol/L — ABNORMAL HIGH (ref 0.0–2.0)
FIO2: 21
O2 Saturation: 56.2 %
Patient temperature: 37
pCO2, Ven: 34.1 mmHg — ABNORMAL LOW (ref 44.0–60.0)
pH, Ven: 7.395 (ref 7.250–7.430)
pO2, Ven: 33.6 mmHg (ref 32.0–45.0)

## 2018-12-29 LAB — COMPREHENSIVE METABOLIC PANEL
ALT: 58 U/L — ABNORMAL HIGH (ref 0–44)
AST: 31 U/L (ref 15–41)
Albumin: 3.9 g/dL (ref 3.5–5.0)
Alkaline Phosphatase: 99 U/L (ref 38–126)
Anion gap: 14 (ref 5–15)
BUN: 19 mg/dL (ref 6–20)
CO2: 18 mmol/L — ABNORMAL LOW (ref 22–32)
Calcium: 8.3 mg/dL — ABNORMAL LOW (ref 8.9–10.3)
Chloride: 97 mmol/L — ABNORMAL LOW (ref 98–111)
Creatinine, Ser: 0.77 mg/dL (ref 0.44–1.00)
GFR calc Af Amer: >60 mL/min (ref >60)
GFR calc non Af Amer: >60 mL/min (ref >60)
Glucose, Bld: 418 mg/dL — ABNORMAL HIGH (ref 70–99)
Potassium: 4.1 mmol/L (ref 3.5–5.1)
Sodium: 129 mmol/L — ABNORMAL LOW (ref 135–145)
Total Bilirubin: 6.5 mg/dL — ABNORMAL HIGH (ref 0.3–1.2)
Total Protein: 6.8 g/dL (ref 6.5–8.1)

## 2018-12-29 LAB — DIRECT ANTIGLOBULIN TEST (NOT AT ARMC)
DAT, IgG: POSITIVE
DAT, complement: POSITIVE

## 2018-12-29 LAB — IRON AND TIBC
Iron: 197 ug/dL — ABNORMAL HIGH (ref 28–170)
Saturation Ratios: 83 % — ABNORMAL HIGH (ref 10.4–31.8)
TIBC: 238 ug/dL — ABNORMAL LOW (ref 250–450)
UIBC: 41 ug/dL

## 2018-12-29 LAB — BILIRUBIN, FRACTIONATED(TOT/DIR/INDIR)
Bilirubin, Direct: 1.4 mg/dL — ABNORMAL HIGH (ref 0.0–0.2)
Indirect Bilirubin: 5.5 mg/dL — ABNORMAL HIGH (ref 0.3–0.9)
Total Bilirubin: 6.9 mg/dL — ABNORMAL HIGH (ref 0.3–1.2)

## 2018-12-29 LAB — LACTATE DEHYDROGENASE: LDH: 371 U/L — ABNORMAL HIGH (ref 98–192)

## 2018-12-29 LAB — POC OCCULT BLOOD, ED: Fecal Occult Bld: NEGATIVE

## 2018-12-29 LAB — VITAMIN B12: Vitamin B-12: >7500 pg/mL — ABNORMAL HIGH (ref 180–914)

## 2018-12-29 LAB — LIPASE, BLOOD: Lipase: 33 U/L (ref 11–51)

## 2018-12-29 LAB — PREPARE RBC (CROSSMATCH)

## 2018-12-29 LAB — FOLATE: Folate: 27.5 ng/mL (ref >5.9)

## 2018-12-29 LAB — FERRITIN: Ferritin: 1139 ng/mL — ABNORMAL HIGH (ref 11–307)

## 2018-12-29 LAB — SARS CORONAVIRUS 2 BY RT PCR (HOSPITAL ORDER, PERFORMED IN ~~LOC~~ HOSPITAL LAB): SARS Coronavirus 2: POSITIVE — AB

## 2018-12-29 MED ORDER — SODIUM CHLORIDE 0.9 % IV BOLUS
1000.0000 mL | Freq: Once | INTRAVENOUS | Status: AC
  Administered 2018-12-29: 1000 mL via INTRAVENOUS

## 2018-12-29 MED ORDER — DEXAMETHASONE SODIUM PHOSPHATE 10 MG/ML IJ SOLN
40.0000 mg | Freq: Once | INTRAMUSCULAR | Status: AC
  Administered 2018-12-29: 40 mg via INTRAMUSCULAR
  Filled 2018-12-29: qty 4

## 2018-12-29 MED ORDER — HYDROMORPHONE HCL 1 MG/ML IJ SOLN
0.5000 mg | Freq: Once | INTRAMUSCULAR | Status: AC
  Administered 2018-12-29: 0.5 mg via INTRAVENOUS
  Filled 2018-12-29: qty 1

## 2018-12-29 MED ORDER — SODIUM CHLORIDE 0.9 % IV BOLUS: 500.0000 mL | Freq: Once | INTRAVENOUS | Status: DC

## 2018-12-29 MED ORDER — PANTOPRAZOLE SODIUM 40 MG IV SOLR
40.0000 mg | INTRAVENOUS | Status: DC
  Administered 2018-12-29 – 2018-12-31 (×3): 40 mg via INTRAVENOUS
  Filled 2018-12-29 (×3): qty 40

## 2018-12-29 MED ORDER — INSULIN ASPART 100 UNIT/ML ~~LOC~~ SOLN
0.0000 [IU] | SUBCUTANEOUS | Status: DC
  Administered 2018-12-30 (×2): 11 [IU] via SUBCUTANEOUS
  Administered 2018-12-30: 15 [IU] via SUBCUTANEOUS
  Administered 2018-12-30: 11 [IU] via SUBCUTANEOUS
  Administered 2018-12-30 (×2): 15 [IU] via SUBCUTANEOUS
  Administered 2018-12-31: 8 [IU] via SUBCUTANEOUS
  Administered 2018-12-31: 5 [IU] via SUBCUTANEOUS
  Administered 2018-12-31 (×2): 3 [IU] via SUBCUTANEOUS
  Filled 2018-12-29 (×2): qty 1

## 2018-12-29 MED ORDER — SODIUM CHLORIDE 0.9 % IV SOLN
10.0000 mL/h | Freq: Once | INTRAVENOUS | Status: AC
  Administered 2018-12-29 (×2): 10 mL/h via INTRAVENOUS

## 2018-12-29 MED ORDER — SODIUM CHLORIDE 0.9 % IV SOLN
1.0000 mg | Freq: Once | INTRAVENOUS | Status: AC
  Administered 2018-12-29: 1 mg via INTRAVENOUS
  Filled 2018-12-29: qty 0.2

## 2018-12-29 MED ORDER — FOLIC ACID 5 MG/ML IJ SOLN
INTRAMUSCULAR | Status: AC
  Filled 2018-12-29: qty 0.2

## 2018-12-29 MED ORDER — ONDANSETRON HCL 4 MG/2ML IJ SOLN
4.0000 mg | Freq: Once | INTRAMUSCULAR | Status: AC
  Administered 2018-12-29: 4 mg via INTRAVENOUS
  Filled 2018-12-29: qty 2

## 2018-12-29 MED ORDER — IOHEXOL 300 MG/ML  SOLN
100.0000 mL | Freq: Once | INTRAMUSCULAR | Status: AC | PRN
  Administered 2018-12-29: 100 mL via INTRAVENOUS

## 2018-12-29 MED ORDER — SODIUM CHLORIDE 0.9 % IV SOLN
INTRAVENOUS | Status: AC
  Administered 2018-12-29: 23:00:00 via INTRAVENOUS

## 2018-12-29 NOTE — ED Notes (Signed)
Dr Denton Brick notified that per blood bank pt's PRBC's will now have to come from [place] and they do not know when it will arrive.

## 2018-12-29 NOTE — H&P (Signed)
History and Physical    Hannah Dickson IFB:[id number] DOB: [birth year] DOA: 12/29/2018  PCP: [name], MD   Patient coming from: Home  I have personally briefly reviewed patient's old medical records in [place]  Chief Complaint: Abdominal pain, jaundice, vomiting 4 days.  HPI: Hannah Dickson is a 53 y.o. female with medical history significant for autoimmune hemolytic anemia, diabetes mellitus, hypertension. Patient is Spanish-speaking, history is obtained with the help of her daughter who is at bedside.   Patient presented to the ED with complaints of nausea, multiple episodes of vomiting over the past 3 to 4 days.  Vomitus is nonbloody.  Abdominal pain involves the right upper abdomen.  Daughter also noticed that patient eyes were yellow about 3 days ago. Patient denies difficulty breathing or cough, no loose stools, no urinary symptoms, no black stools.  ED Course: Tachycardic to 129, hypotensive blood pressure systolic 47-092.  Hemoglobin 3.2.  Leukocytosis 16.5, and platelets 521.  Elevated total bilirubin 6.5, ALT 58..  Mild elevated creatinine 0.7.  Glucose 418.  With normal anion gap of 14.  Stool FOBT negative.  UA with glucose and ketones.  CT pelvis with contrast negative for acute abnormality.  Portable chest x-ray shows linear opacity in the left lower lung field-atelectasis or airspace consolidation..  2 units of blood ordered for transfusion in the ED, blood has to come from [facility] because of her antibodies hence delaying transfusion. EDP talked to oncology on call Dr. Delton Coombes who recommended IV dexamethasone, and to both patient would continue to hemolyze.  Okay to admit here at [facility].  SARs Covid test came back positive hence patient will be admitted to [facility].  Review of Systems: As per HPI all other systems reviewed and negative.  Past Medical History:  Diagnosis Date  . Acquired hemolytic anemia ([place])  01/08/2016  . Allergy   . Anemia   . Arthritis   . Diabetes mellitus without complication ([place])   . Hypertension   . Shortness of breath dyspnea    slight according to patient due to anemia  . Visual changes     Past Surgical History:  Procedure Laterality Date  . ANKLE SURGERY Left 2006  . CESAREAN SECTION       reports that she has never smoked. She has never used smokeless tobacco. She reports that she does not drink alcohol or use drugs.  No Known Allergies  Family History  Problem Relation Age of Onset  . Diabetes Mother   . Cancer Father     Prior to Admission medications   Medication Sig Start Date End Date Taking? Authorizing Provider  acetaminophen (TYLENOL) 325 MG tablet Take 2 tablets (650 mg total) by mouth every 6 (six) hours as needed for mild pain (or Fever >/= 101). 01/10/16  Yes Memon, Jolaine Artist, MD  atorvastatin (LIPITOR) 20 MG tablet Take 1 tablet by mouth daily.   Yes [provider]  folic acid (FOLVITE) 1 MG tablet Take 1 tablet by mouth daily. 04/29/18 04/29/19 Yes [provider]  glimepiride (AMARYL) 4 MG tablet Take 1 tablet by mouth daily.   Yes [provider]  lisinopril (ZESTRIL) 2.5 MG tablet Take 1 tablet by mouth daily.   Yes [provider]  metFORMIN (GLUCOPHAGE) 1000 MG tablet Take 1 tablet (1,000 mg total) by mouth 2 (two) times daily with a meal. 01/10/16  Yes Memon, Jolaine Artist, MD  Multiple Vitamin (THERA) TABS Take 1 tablet by mouth daily.  Yes [provider]    Physical Exam: Vitals:   12/29/18 2048 12/29/18 2051 12/29/18 2100 12/29/18 2130  BP: (!) 92/57 (!) 84/51 (!) 82/47 (!) 81/45  Pulse: (!) 116 (!) 120 (!) 116 (!) 115  Resp: (!) 29 (!) 23 (!) 22 (!) 28  Temp:      TempSrc:      SpO2: 97% 99% 100% 99%  Weight:      Height:        Constitutional: Icteric calm, comfortable Vitals:   12/29/18 2048 12/29/18 2051 12/29/18 2100 12/29/18 2130  BP: (!) 92/57 (!) 84/51 (!) 82/47 (!)  81/45  Pulse: (!) 116 (!) 120 (!) 116 (!) 115  Resp: (!) 29 (!) 23 (!) 22 (!) 28  Temp:      TempSrc:      SpO2: 97% 99% 100% 99%  Weight:      Height:       Eyes: PERRL, lids normal, scleral icterus ENMT: Mucous membranes are moist. Posterior pharynx clear of any exudate or lesions. Neck: normal, supple, no masses, no thyromegaly Respiratory: clear to auscultation bilaterally, no wheezing, no crackles. Normal respiratory effort. No accessory muscle use.  Cardiovascular: Tachycardic, rate and rhythm, no murmurs / rubs / gallops. No extremity edema. 2+ pedal pulses.   Abdomen: no tenderness, no masses palpated. No hepatosplenomegaly. Bowel sounds positive.  Musculoskeletal: no clubbing / cyanosis. No joint deformity upper and lower extremities. Good ROM, no contractures. Normal muscle tone.  Skin: no rashes, lesions, ulcers. No induration Neurologic: CN 2-12 grossly intact.   Strength 5/5 in all 4.  Psychiatric: Normal judgment and insight. Alert and oriented x 3. Normal mood.   Labs on Admission: I have personally reviewed following labs and imaging studies  CBC: Recent Labs  Lab 12/29/18 1531  WBC 16.5*  NEUTROABS 12.5*  HGB 3.2*  HCT 9.5*  MCV 96.0  PLT 801*   Basic Metabolic Panel: Recent Labs  Lab 12/29/18 1459  NA 129*  K 4.1  CL 97*  CO2 18*  GLUCOSE 418*  BUN 19  CREATININE 0.77  CALCIUM 8.3*   Liver Function Tests: Recent Labs  Lab 12/29/18 1459 12/29/18 1544  AST 31  --   ALT 58*  --   ALKPHOS 99  --   BILITOT 6.5* 6.9*  PROT 6.8  --   ALBUMIN 3.9  --    Recent Labs  Lab 12/29/18 1459  LIPASE 33   Anemia Panel: Recent Labs    12/29/18 1544  VITAMINB12 >7,500*  FOLATE 27.5  FERRITIN 1,139*  TIBC 238*  IRON 197*  RETICCTPCT 4.3*   Urine analysis:    Component Value Date/Time   COLORURINE AMBER (A) 12/29/2018 1515   APPEARANCEUR CLEAR 12/29/2018 1515   LABSPEC 1.016 12/29/2018 1515   PHURINE 5.0 12/29/2018 1515   GLUCOSEU >=500  (A) 12/29/2018 1515   HGBUR NEGATIVE 12/29/2018 1515   BILIRUBINUR NEGATIVE 12/29/2018 1515   KETONESUR 5 (A) 12/29/2018 1515   PROTEINUR NEGATIVE 12/29/2018 1515   NITRITE NEGATIVE 12/29/2018 1515   LEUKOCYTESUR NEGATIVE 12/29/2018 1515    Radiological Exams on Admission: Ct Abdomen Pelvis W Contrast  Result Date: 12/29/2018 CLINICAL DATA:  Pt has been nauseous for the last 4 days. Has thrown up 3-4 times in the last 24 hours. Is having abdominal pain and is jaundiced. No history of liver disease. EXAM: CT ABDOMEN AND PELVIS WITH CONTRAST TECHNIQUE: Multidetector CT imaging of the abdomen and pelvis was performed using the standard  protocol following bolus administration of intravenous contrast. CONTRAST:  1101m OMNIPAQUE IOHEXOL 300 MG/ML  SOLN COMPARISON:  None. FINDINGS: Lower chest: No acute abnormality. Hepatobiliary: No focal liver abnormality is seen. No gallstones, gallbladder wall thickening, or biliary dilatation. Pancreas: Unremarkable. No pancreatic ductal dilatation or surrounding inflammatory changes. Spleen: Normal in size without focal abnormality. Adrenals/Urinary Tract: Adrenal glands are unremarkable. Kidneys are normal, without renal calculi, focal lesion, or hydronephrosis. Bladder is unremarkable. Stomach/Bowel: Stomach is within normal limits. Appendix appears normal. No evidence of bowel wall thickening, distention, or inflammatory changes. Vascular/Lymphatic: No significant vascular findings are present. No enlarged abdominal or pelvic lymph nodes. Reproductive: Uterus and bilateral adnexa are unremarkable. Other: No abdominal wall hernia or abnormality. No abdominopelvic ascites. Musculoskeletal: No acute osseous abnormality. No aggressive osseous lesion. Bilateral facet arthropathy at L5-S1. IMPRESSION: 1. No acute abdominal or pelvic pathology. 2. Normal appendix. Electronically Signed   By: HKathreen Devoid  On: 12/29/2018 16:47   Dg Chest Portable 1 View  Result Date:  12/29/2018 CLINICAL DATA:  Nausea and emesis. Shortness of breath and fever. EXAM: PORTABLE CHEST 1 VIEW COMPARISON:  December 29, 2018 FINDINGS: Cardiomediastinal silhouette is normal. Mediastinal contours appear intact. Low lung volumes. Linear opacity in the left lower lung field. Osseous structures are without acute abnormality. Soft tissues are grossly normal. IMPRESSION: 1. Low lung volumes. 2. Linear opacity in the left lower lung field may represent atelectasis or airspace consolidation. Electronically Signed   By: DFidela SalisburyM.D.   On: 12/29/2018 18:41    EKG: Independently reviewed.   Assessment/Plan Active Problems:   Acute hemolytic anemia (HCC)    Acute hemolytic anemia- hemoglobin 3.2.  With elevated total bilirubin 6.5, mild ALT elevation 58.  Likely provoked a SARS-CoV-2 viral infection. Hypotensive.  Right upper quadrant pain but abdominal CT without acute abnormality.  History of autoimmune hemolytic anemia.  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  LDH today 371, EDP talked to Dr. KDelton Coombesokay to admit here, IV dexamethasone 40 mg. -I talked to Dr. KDelton Coombes recommended steroids, transfusing 2 to 3 units, but hgb may not rise as expected as she is currently hemolyzing. -Daily LDH, reticulocyte counts, total and direct bilirubin -Daily CBC -Oncology consult -Daily folic acid, multivits -Protonix 40 mg daily IV -With positive COVID, [facility] as she also needs oncology evaluation. -[facility] to [facility], plans to [facility]  -CBC a.m. -Normal saline 100cc/hr X 15hrs -Admit to stepdown considering severity of anemia and hypotension. -Addendum- due to complexity of patient's case and the fact [facility] I recommended on the outset to EDP [facility]  ED provider talked to patient and told me patient wanted to be admitted here.  ED provider talked to Dr. KDelton Coombes [facility]  I called [facility] KDelton [facility]ombesto confirm [facility] He recommended folic acid, daily reticulocyte LDH total and direct bilirubin and daily CBC.  Also recommended transfusion 2 to 3 units but not to expect normal rise in hemoglobin with transfusion as patient is still hemolyzing.   SARS-CoV-2 test came back positive hence [facility]  ED nurse told me- no compatible blood available for patient at  [facility] due to antibodies, attempting to get compatible blood from [place].  Unclear arrival time.   I called Oncologist on call for [facility], Dr. Irene Limbo recommended that patient be transferred to [facility] where she has been getting all her care, and her providers are more familiar with her, as it is also unknown if she is steroid responsive, what treatment she had could previously, also considering this significant delay in getting blood transfused.   I talked to patient and her daughter, they tell me that they would like to be transferred to [facility] and were told initially by EDP that it would be faster to get admitted here, hence they agreed to be admitted here. I have called the ED provider and told her Dr. Grier Mitts recommendation that patient be transferred.  ED provider to arrange transfer to [facility].  ED provider talked to oncology Dr. Sabra Heck.  No beds at [facility] to call daily, if any beds open up she may be transferred.  At this time he had no additional recommendations, said patient had gotten appropriate dose of steroids, and recommends blood transfusion.  Hence patient will be admitted at [facility]. -Oncology had recommended we re-consult in the morning. I called back to give update about  admission to [facility], awaiting response.  Abnormal chest x-ray-chest x-ray shows linear opacity in the left lower lung, atelectasis or airspace disease.  Patient denies respiratory symptoms.  SARS-CoV-2's positive. -Monitor for now.  Hyponatremia-sodium 129.  Likely secondary to multiple episodes of vomiting. -Hydrate normal saline bolus - BMP A.m.  Hypotension-systolic 25E to 479.  Due to vomiting, ongoing hemolysis. -Hydrate and transfuse 2 units of blood -Hold home lisinopril  Diabetes mellitus-random glucose 418.  Mild acidosis bicarb 18 but with normal anion gap 14.  Glucose likely to increase with steroids. - SSI -Continue home glimepiride, hold metformin - HGBa1c   Nycholas Rayner Arlyce Dice MD [facility]  12/29/2018, 12:04 AM

## 2018-12-29 NOTE — ED Triage Notes (Signed)
Pt has been nauseous for the last 4 days. Has thrown up 3-4 times in the last 24 hours. Is having abdominal pain and is jaundiced. No history of liver disease.

## 2018-12-29 NOTE — ED Notes (Signed)
Date and time results received: 12/29/18 1606 (use smartphrase ".now" to insert current time)  Test: hgb Critical Value: 3.2  Name of Provider Notified: [email or web address]  Orders Received? Or Actions Taken?:

## 2018-12-29 NOTE — ED Notes (Signed)
ED Provider at bedside. 

## 2018-12-29 NOTE — ED Provider Notes (Signed)
[facility] EMERGENCY DEPARTMENT Provider Note   CSN: [id number] Arrival date & time: 12/29/18  1326    History   Chief Complaint Chief Complaint  Patient presents with   Nausea    HPI Hannah Dickson is a 53 y.o. female with history of acquired hemolytic anemia, diabetes mellitus, hypertension presents for evaluation of acute onset, progressively worsening nausea and vomiting for 3 days.  She reports that every time she stands up she will have an episode of nonbloody nonbilious emesis.  She feels better laying flat.  She also notes some upper abdominal pain that she describes as a severe pressure.  She appears jaundiced with scleral icterus which daughter seemed noticed around 3 days ago.  Patient reports she has not been able to tolerate any p.o. intake for the last 3 days.  Denies fever, chest pain, cough, or shortness of breath.  Denies urinary symptoms, diarrhea, constipation, melena, or hematochezia.  Daughter reports patient has been taking her folic acid as prescribed.  Per most recent note from her hematologist through [facility] on 04/21/2018: "53 y.o.hispanic F (from [place]) w/3-year h/o DM type 2, HTN and arthritis who was transferred to [facility] from OSH for anemia 2/2 hemolysis. 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 She had previously been told she had a low hgb in Feb 2017 requiring transfusion. In July 2017, she was found to have a hgb of 11, wbc 0.7, platelets 161; 2 weeks later, her hgb was noted to be 5.5. 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     The history is provided by the patient. The history is limited by a language barrier.    Past Medical History:  Diagnosis Date   Acquired hemolytic anemia ([place]) 01/08/2016   Allergy    Anemia    Arthritis     Diabetes mellitus without complication (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      Home Medications    Prior to Admission medications   Medication Sig Start Date End Date Taking? Authorizing Provider  acetaminophen (TYLENOL) 325 MG tablet Take 2 tablets (650 mg total) by mouth every 6 (six) hours as needed for mild pain (or Fever >/= 101). 01/10/16  Yes Memon, Jolaine Artist, MD  atorvastatin (LIPITOR) 20 MG tablet Take 1 tablet by mouth daily.   Yes [provider]  folic acid (FOLVITE) 1 MG tablet Take 1 tablet by mouth daily. 04/29/18 04/29/19 Yes [provider]  glimepiride (AMARYL) 4 MG  tablet Take 1 tablet by mouth daily.   Yes [provider]  lisinopril (ZESTRIL) 2.5 MG tablet Take 1 tablet by mouth daily.   Yes [provider]  metFORMIN (GLUCOPHAGE) 1000 MG tablet Take 1 tablet (1,000 mg total) by mouth 2 (two) times daily with a meal. 01/10/16  Yes Memon, Jolaine Artist, MD  Multiple Vitamin (THERA) TABS Take 1 tablet by mouth daily.   Yes [provider]    Family History Family History  Problem Relation Age of Onset   Diabetes Mother      Cancer Father     Social History Social History   Tobacco Use   Smoking status: Never Smoker   Smokeless tobacco: Never Used  Substance Use Topics   Alcohol use: No   Drug use: No     Allergies   Patient has no known allergies.   Review of Systems Review of Systems  Constitutional: Positive for fatigue. Negative for chills and fever.  Respiratory: Negative for shortness of breath.   Cardiovascular: Negative for chest pain.  Gastrointestinal: Positive for abdominal pain, nausea and vomiting. Negative for constipation and diarrhea.  Skin: Positive for color change.  All other systems reviewed and are negative.    Physical Exam Updated Vital Signs BP 114/66    Pulse (!) 103    Temp 97.9 F (36.6 C) (Oral)    Resp (!) 36    Ht _0  (1.473 m)    Wt 68 kg    SpO2 98%    BMI 31.35 kg/m   Physical Exam Vitals signs and nursing note reviewed.  Constitutional:      General: She is not in acute distress.    Appearance: She is well-developed. She is ill-appearing.  HENT:     Head: Normocephalic and atraumatic.  Eyes:     General:        Right eye: No discharge.        Left eye: No discharge.     Conjunctiva/sclera: Conjunctivae normal.  Neck:     Vascular: No JVD.     Trachea: No tracheal deviation.  Cardiovascular:     Rate and Rhythm: Regular rhythm. Tachycardia present.     Pulses: Normal pulses.     Heart sounds: Normal heart sounds.  Pulmonary:     Effort: Pulmonary effort is normal.     Breath sounds: Normal breath sounds.  Abdominal:     General: Abdomen is flat. Bowel sounds are normal. There is no distension.     Palpations: Abdomen is soft.     Tenderness: There is abdominal tenderness in the right upper quadrant, right lower quadrant, epigastric area, periumbilical area and suprapubic area. There is no right CVA tenderness, left CVA tenderness, guarding or rebound. Negative signs include Murphy's sign and Rovsing's sign.  Genitourinary:     Comments: Examination performed in the presence of a chaperone.  No frank rectal bleeding, no anal fissures or tears, no external hemorrhoids.  Scant amount of light brown stool in the rectal vault. Skin:    General: Skin is warm and dry.     Findings: No erythema.  Neurological:     Mental Status: She is alert.  Psychiatric:        Behavior: Behavior normal.      ED Treatments / Results  Labs (all labs ordered are listed, but only abnormal results are displayed) Labs Reviewed  SARS CORONAVIRUS 2 ([facility]) - Abnormal; Notable for  the following components:      Result Value   SARS Coronavirus 2 POSITIVE (*)    All other components within normal limits  COMPREHENSIVE METABOLIC PANEL - Abnormal; Notable for the following components:   Sodium 129 (*)    Chloride 97 (*)    CO2 18 (*)    Glucose, Bld 418 (*)    Calcium 8.3 (*)    ALT 58 (*)    Total Bilirubin 6.5 (*)    All other components within normal limits  URINALYSIS, ROUTINE W REFLEX MICROSCOPIC - Abnormal; Notable for the following components:   Color, Urine AMBER (*)    Glucose, UA >=500 (*)    Ketones, ur 5 (*)    All other components within normal limits  CBC WITH DIFFERENTIAL/PLATELET - Abnormal; Notable for the following components:   WBC 16.5 (*)    RBC 0.99 (*)    Hemoglobin 3.2 (*)    HCT 9.5 (*)    Platelets 521 (*)    nRBC 0.4 (*)    Neutro Abs 12.5 (*)    Monocytes Absolute 1.1 (*)    Abs Immature Granulocytes 0.45 (*)    All other components within normal limits  VITAMIN B12 - Abnormal; Notable for the following components:   Vitamin B-12 >7,500 (*)    All other components within normal limits  IRON AND TIBC - Abnormal; Notable for the following components:   Iron 197 (*)    TIBC 238 (*)    Saturation Ratios 83 (*)    All other components within normal limits  FERRITIN - Abnormal; Notable for the following components:   Ferritin 1,139 (*)    All other  components within normal limits  RETICULOCYTES - Abnormal; Notable for the following components:   Retic Ct Pct 4.3 (*)    RBC. 1.02 (*)    Immature Retic Fract 41.6 (*)    All other components within normal limits  BLOOD GAS, VENOUS - Abnormal; Notable for the following components:   pCO2, Ven 34.1 (*)    Acid-base deficit 3.6 (*)    All other components within normal limits  LACTATE DEHYDROGENASE - Abnormal; Notable for the following components:   LDH 371 (*)    All other components within normal limits  BILIRUBIN, FRACTIONATED(TOT/DIR/INDIR) - Abnormal; Notable for the following components:   Total Bilirubin 6.9 (*)    Bilirubin, Direct 1.4 (*)    Indirect Bilirubin 5.5 (*)    All other components within normal limits  HEMOGLOBIN A1C - Abnormal; Notable for the following components:   Hgb A1c MFr Bld 6.4 (*)    All other components within normal limits  GLUCOSE, CAPILLARY - Abnormal; Notable for the following components:   Glucose-Capillary 314 (*)    All other components within normal limits  AMMONIA - Abnormal; Notable for the following components:   Ammonia 41 (*)    All other components within normal limits  GLUCOSE, CAPILLARY - Abnormal; Notable for the following components:   Glucose-Capillary 313 (*)    All other components within normal limits  GLUCOSE, CAPILLARY - Abnormal; Notable for the following components:   Glucose-Capillary 355 (*)    All other components within normal limits  CBG MONITORING, ED - Abnormal; Notable for the following components:   Glucose-Capillary 409 (*)    All other components within normal limits  CBG MONITORING, ED - Abnormal; Notable for the following components:   Glucose-Capillary 389 (*)    All other components within  normal limits  CBG MONITORING, ED - Abnormal; Notable for the following components:   Glucose-Capillary 375 (*)    All other components within normal limits  MRSA PCR SCREENING  LIPASE, BLOOD  FOLATE  CBC WITH  DIFFERENTIAL/PLATELET  HIV ANTIBODY (ROUTINE TESTING W REFLEX)  CBC WITH DIFFERENTIAL/PLATELET  MULTIPLE MYELOMA PANEL, SERUM  COMPREHENSIVE METABOLIC PANEL  CBC  CBC  LACTATE DEHYDROGENASE  RETICULOCYTES  FERRITIN  C-REACTIVE PROTEIN  POC OCCULT BLOOD, ED  TYPE AND SCREEN  PREPARE RBC (CROSSMATCH)  DIRECT ANTIGLOBULIN TEST (NOT AT [facility])  TYPE AND SCREEN    EKG EKG Interpretation  Date/Time:  Tuesday December 29 2018 17:38:23 EDT Ventricular Rate:  127 PR Interval:    QRS Duration: 103 QT Interval:  333 QTC Calculation: 484 R Axis:   83 Text Interpretation:  Sinus tachycardia Abnormal R-wave progression, early transition Borderline ST depression, diffuse leads When compared with ECG of 01/08/2016 Rate faster Nonspecific ST and T wave abnormality is now Present Confirmed by Francine Graven [phone]) on 12/29/2018 5:42:17 PM   Radiology Ct Abdomen Pelvis W Contrast  Result Date: 12/29/2018 CLINICAL DATA:  Pt has been nauseous for the last 4 days. Has thrown up 3-4 times in the last 24 hours. Is having abdominal pain and is jaundiced. No history of liver disease. EXAM: CT ABDOMEN AND PELVIS WITH CONTRAST TECHNIQUE: Multidetector CT imaging of the abdomen and pelvis was performed using the standard protocol following bolus administration of intravenous contrast. CONTRAST:  145m OMNIPAQUE IOHEXOL 300 MG/ML  SOLN COMPARISON:  None. FINDINGS: Lower chest: No acute abnormality. Hepatobiliary: No focal liver abnormality is seen. No gallstones, gallbladder wall thickening, or biliary dilatation. Pancreas: Unremarkable. No pancreatic ductal dilatation or surrounding inflammatory changes. Spleen: Normal in size without focal abnormality. Adrenals/Urinary Tract: Adrenal glands are unremarkable. Kidneys are normal, without renal calculi, focal lesion, or hydronephrosis. Bladder is unremarkable. Stomach/Bowel: Stomach is within normal limits. Appendix appears normal. No evidence of bowel wall  thickening, distention, or inflammatory changes. Vascular/Lymphatic: No significant vascular findings are present. No enlarged abdominal or pelvic lymph nodes. Reproductive: Uterus and bilateral adnexa are unremarkable. Other: No abdominal wall hernia or abnormality. No abdominopelvic ascites. Musculoskeletal: No acute osseous abnormality. No aggressive osseous lesion. Bilateral facet arthropathy at L5-S1. IMPRESSION: 1. No acute abdominal or pelvic pathology. 2. Normal appendix. Electronically Signed   By: HKathreen Devoid  On: 12/29/2018 16:47   Dg Chest Portable 1 View  Result Date: 12/29/2018 CLINICAL DATA:  Nausea and emesis. Shortness of breath and fever. EXAM: PORTABLE CHEST 1 VIEW COMPARISON:  December 29, 2018 FINDINGS: Cardiomediastinal silhouette is normal. Mediastinal contours appear intact. Low lung volumes. Linear opacity in the left lower lung field. Osseous structures are without acute abnormality. Soft tissues are grossly normal. IMPRESSION: 1. Low lung volumes. 2. Linear opacity in the left lower lung field may represent atelectasis or airspace consolidation. Electronically Signed   By: DFidela SalisburyM.D.   On: 12/29/2018 18:41    Procedures .Critical Care Performed by: FRenita Papa PA-C Authorized by: FRenita Papa PA-C   Critical care provider statement:    Critical care time (minutes):  90   Critical care was necessary to treat or prevent imminent or life-threatening deterioration of the following conditions:  Circulatory failure   Critical care was time spent personally by me on the following activities:  Discussions with consultants, evaluation of patient's response to treatment, examination of patient, ordering and performing treatments and interventions, ordering and review of laboratory  studies, ordering and review of radiographic studies, pulse oximetry, re-evaluation of patient's condition, obtaining history from patient or surrogate and review of old charts   I assumed  direction of critical care for this patient from another provider in my specialty: no     (including critical care time)  Medications Ordered in ED Medications  sodium chloride 0.9 % bolus 500 mL (500 mLs Intravenous Not Given 12/29/18 2128)  pantoprazole (PROTONIX) injection 40 mg (40 mg Intravenous Given 12/29/18 2312)  0.9 %  sodium chloride infusion ( Intravenous Stopped 12/30/18 1113)  atorvastatin (LIPITOR) tablet 20 mg (20 mg Oral Given 12/30/18 0914)  glimepiride (AMARYL) tablet 4 mg (4 mg Oral Given 12/30/18 0922)  acetaminophen (TYLENOL) tablet 650 mg (has no administration in time range)    Or  acetaminophen (TYLENOL) suppository 650 mg (has no administration in time range)  polyethylene glycol (MIRALAX / GLYCOLAX) packet 17 g (has no administration in time range)  insulin aspart (novoLOG) injection 0-15 Units (15 Units Subcutaneous Given 12/30/18 1553)  Chlorhexidine Gluconate Cloth 2 % PADS 6 each (has no administration in time range)  ondansetron (ZOFRAN) injection 4 mg (has no administration in time range)  B-complex with vitamin C tablet 1 tablet (1 tablet Oral Given 12/30/18 1005)  dexamethasone (DECADRON) tablet 40 mg (40 mg Oral Given 3/90/30 0923)  folic acid (FOLVITE) tablet 5 mg (has no administration in time range)  insulin glargine (LANTUS) injection 12 Units (12 Units Subcutaneous Given 12/30/18 1340)  HYDROmorphone (DILAUDID) injection 0.5 mg (0.5 mg Intravenous Given 12/29/18 1636)  ondansetron (ZOFRAN) injection 4 mg (4 mg Intravenous Given 12/29/18 1612)  sodium chloride 0.9 % bolus 1,000 mL (0 mLs Intravenous Stopped 12/29/18 1744)  iohexol (OMNIPAQUE) 300 MG/ML solution 100 mL (100 mLs Intravenous Contrast Given 12/29/18 1550)  0.9 %  sodium chloride infusion (0 mL/hr Intravenous Stopped 12/29/18 2324)  ondansetron (ZOFRAN) injection 4 mg (4 mg Intravenous Given 12/29/18 1751)  dexamethasone (DECADRON) injection 40 mg (40 mg Intramuscular Given 3/00/76 2263)  folic acid  1 mg in sodium chloride 0.9 % 50 mL IVPB (0 mg Intravenous Stopped 12/30/18 0016)  ondansetron (ZOFRAN) 4 MG/2ML injection (4 mg  Given 12/30/18 0804)  acetaminophen (TYLENOL) tablet 650 mg (650 mg Oral Given 12/30/18 0930)  diphenhydrAMINE (BENADRYL) capsule 25 mg (25 mg Oral Given 12/30/18 0930)  famotidine (PEPCID) IVPB 20 mg premix (0 mg Intravenous Stopped 3/35/45 6256)  folic acid (FOLVITE) tablet 4 mg (0 mg Oral Duplicate 3/89/37 3428)  lactulose (CHRONULAC) 10 GM/15ML solution 20 g (20 g Oral Given 7/68/11 5726)  folic acid 5 MG/ML injection (has no administration in time range)     Initial Impression / Assessment and Plan / ED Course  I have reviewed the triage vital signs and the nursing notes.  Pertinent labs & imaging results that were available during my care of the patient were reviewed by me and considered in my medical decision making (see chart for details).          Hannah Dickson was evaluated in Emergency Department on 12/30/2018 for the symptoms described in the history of present illness. She was evaluated in the context of the global COVID-19 pandemic, which necessitated consideration that the patient might be at risk for infection with the SARS-CoV-2 virus that causes COVID-19. Institutional protocols and algorithms that pertain to the evaluation of patients at risk for COVID-19 are in a state of rapid change based on information released by regulatory bodies including the CDC and federal  and state organizations. These policies and algorithms were followed during the patient's care in the ED.   Patient presents for evaluation of upper abdominal pain with nausea vomiting, scleral icterus and jaundice for 3 days.  She is borderline febrile, tachycardic and intermittently hypotensive in the ED.  She is uncomfortable in appearance, no peritoneal signs on examination of the abdomen.  Lab work today significant for anemia with hemoglobin of 3.2, down from 12.9  based on chart review using care everywhere reviewing patient's most recent visit to her hematologist on 04/21/2018.  She has a leukocytosis of 16.5, elevated platelet count of 521.  She is also hyperglycemic but does not appear to be in DKA as her anion gap is within normal limits and her VBG shows normal blood pH.  Total bilirubin is elevated at 6.5.  Imaging today is negative for acute surgical abdominal pathology.  Chest x-ray shows left lower lung opacity which could reflect atelectasis versus airspace consolidation.  She is unfortunately COVID positive.  Hemoccult is negative and the patient denies any melena, hematochezia, hematemesis, and has not had a menstrual cycle in 5 years.  2 units packed red blood cells ordered.   5:27PM Spoke with Dr. Denton Brick with [facility] hospitalist service who recommends consulting heme-onc for further recommendations.  She expressed some concerns that the patient may be more appropriate to transfer to [facility] as she was most recently seen by hematologist Dr. Sherral Hammers in their system in November 2019.  I spoke with the patient and her daughter who state that they would prefer to stay within the [facility] system if possible.  5:41PM CONSULT:Spoke with Dr. Delton Coombes with heme-onc who recommends giving the patient 40 mg IV Decadron, obtaining LDH, Coombs test, and fractionated bilirubin.  He also recommends rechecking CBC 1 hour after transfusion of first unit of PRBCs.  I again spoke with Dr. Denton Brick who agrees to assume care of patient and bring her into the hospital for further evaluation and management.  10:30 PM Spoke with Dr. Denton Brick.  Patient has not received her blood products which have needed to come from [facility] due to limited blood products at [facility].  She has consulted with Dr. Irene Limbo with Heme-onc at [facility] who recommends transfer to [facility] due to significant difficulty obtaining blood due to patient's antibodies - in fact,  blood bank is attempting to find compatible PRBCs from [place], [place] with unclear arrival time. Emergent release considered, but with patient's significant antibody load she runs the risk of massive acute transfusion reaction. Patient and daughter are amenable to transfer at this time.   11:02 PM Spoke with Margarito Courser with PAL line at [facility]. No ICU/SDU beds available.   11:09 PM CONSULT: Spoke with Dr. Sabra Heck with Heme-onc at [facility]. There is no bed availability there at all unfortunately.  He did review the patient's chart and at this time recommends blood transfusion as Decadron has already been administered at an appropriate dose.  He does note that he is on call all weekend recommends that her care team call back daily to see if any beds open up so that she may be transferred.  11:15 PM Dr. Thurnell Garbe spoke with Dr. Denton Brick who agrees to assume care of patient and arrange for transfer to [facility]   Final Clinical Impressions(s) / ED Diagnoses   Final diagnoses:  COVID-19 virus infection  Autoimmune hemolytic anemia (HCC)  Symptomatic anemia    ED  Discharge Orders    None       Renita Papa, PA-C 12/30/18 1713    Francine Graven, DO 01/01/19 1554

## 2018-12-30 ENCOUNTER — Encounter (HOSPITAL_COMMUNITY): Payer: Self-pay | Admitting: Oncology

## 2018-12-30 DIAGNOSIS — D591 Other autoimmune hemolytic anemias: Secondary | ICD-10-CM

## 2018-12-30 DIAGNOSIS — G9341 Metabolic encephalopathy: Secondary | ICD-10-CM | POA: Diagnosis present

## 2018-12-30 LAB — CBC WITH DIFFERENTIAL/PLATELET
Abs Immature Granulocytes: 1.13 10*3/uL — ABNORMAL HIGH (ref 0.00–0.07)
Basophils Absolute: 0 10*3/uL (ref 0.0–0.1)
Basophils Relative: 0 %
Eosinophils Absolute: 0 10*3/uL (ref 0.0–0.5)
Eosinophils Relative: 0 %
HCT: 18.1 % — ABNORMAL LOW (ref 36.0–46.0)
Hemoglobin: 6.5 g/dL — CL (ref 12.0–15.0)
Immature Granulocytes: 4 %
Lymphocytes Relative: 12 %
Lymphs Abs: 3 10*3/uL (ref 0.7–4.0)
MCH: 36.3 pg — ABNORMAL HIGH (ref 26.0–34.0)
MCHC: 35.9 g/dL (ref 30.0–36.0)
MCV: 101.1 fL — ABNORMAL HIGH (ref 80.0–100.0)
Monocytes Absolute: 1.1 10*3/uL — ABNORMAL HIGH (ref 0.1–1.0)
Monocytes Relative: 4 %
Neutro Abs: 20.7 10*3/uL — ABNORMAL HIGH (ref 1.7–7.7)
Neutrophils Relative %: 80 %
Platelets: 348 10*3/uL (ref 150–400)
RBC: 1.79 MIL/uL — ABNORMAL LOW (ref 3.87–5.11)
WBC: 25.9 10*3/uL — ABNORMAL HIGH (ref 4.0–10.5)
nRBC: 2 % — ABNORMAL HIGH (ref 0.0–0.2)

## 2018-12-30 LAB — CBG MONITORING, ED
Glucose-Capillary: 375 mg/dL — ABNORMAL HIGH (ref 70–99)
Glucose-Capillary: 389 mg/dL — ABNORMAL HIGH (ref 70–99)
Glucose-Capillary: 409 mg/dL — ABNORMAL HIGH (ref 70–99)

## 2018-12-30 LAB — TYPE AND SCREEN
ABO/RH(D): O POS
Antibody Screen: POSITIVE
DAT, IgG: POSITIVE
Unit division: 0
Unit division: 0
Unit division: 0
Unit division: 0

## 2018-12-30 LAB — BPAM RBC
Blood Product Expiration Date: 202008072359
Blood Product Expiration Date: 202008132359
Blood Product Expiration Date: 202008132359
Blood Product Expiration Date: 202008212359
Unit Type and Rh: 5100
Unit Type and Rh: 5100
Unit Type and Rh: 5100
Unit Type and Rh: 9500

## 2018-12-30 LAB — MRSA PCR SCREENING: MRSA by PCR: NEGATIVE

## 2018-12-30 LAB — GLUCOSE, CAPILLARY
Glucose-Capillary: 314 mg/dL — ABNORMAL HIGH (ref 70–99)
Glucose-Capillary: 313 mg/dL — ABNORMAL HIGH (ref 70–99)
Glucose-Capillary: 355 mg/dL — ABNORMAL HIGH (ref 70–99)
Glucose-Capillary: 340 mg/dL — ABNORMAL HIGH (ref 70–99)

## 2018-12-30 LAB — HEMOGLOBIN A1C
Hgb A1c MFr Bld: 6.4 % — ABNORMAL HIGH (ref 4.8–5.6)
Mean Plasma Glucose: 136.98 mg/dL

## 2018-12-30 LAB — AMMONIA: Ammonia: 41 umol/L — ABNORMAL HIGH (ref 9–35)

## 2018-12-30 MED ORDER — ACETAMINOPHEN 650 MG RE SUPP: 650.0000 mg | Freq: Four times a day (QID) | RECTAL | Status: DC | PRN

## 2018-12-30 MED ORDER — ONDANSETRON HCL 4 MG/2ML IJ SOLN
INTRAMUSCULAR | Status: AC
  Administered 2018-12-30: 08:00:00 4 mg
  Filled 2018-12-30: qty 2

## 2018-12-30 MED ORDER — ONDANSETRON HCL 4 MG/2ML IJ SOLN
4.0000 mg | Freq: Four times a day (QID) | INTRAMUSCULAR | Status: DC | PRN
  Administered 2019-01-01: 4 mg via INTRAVENOUS
  Filled 2018-12-30: qty 2

## 2018-12-30 MED ORDER — B COMPLEX-C PO TABS
1.0000 | ORAL_TABLET | Freq: Every day | ORAL | Status: DC
  Administered 2018-12-30 – 2019-01-08 (×11): 1 via ORAL
  Filled 2018-12-30 (×12): qty 1

## 2018-12-30 MED ORDER — ATORVASTATIN CALCIUM 10 MG PO TABS
20.0000 mg | ORAL_TABLET | Freq: Every day | ORAL | Status: DC
  Administered 2018-12-30 – 2019-01-08 (×10): 20 mg via ORAL
  Filled 2018-12-30 (×12): qty 2

## 2018-12-30 MED ORDER — FAMOTIDINE IN NACL 20-0.9 MG/50ML-% IV SOLN
20.0000 mg | Freq: Once | INTRAVENOUS | Status: AC
  Administered 2018-12-30: 10:00:00 20 mg via INTRAVENOUS
  Filled 2018-12-30: qty 50

## 2018-12-30 MED ORDER — CHLORHEXIDINE GLUCONATE CLOTH 2 % EX PADS
6.0000 | MEDICATED_PAD | Freq: Every day | CUTANEOUS | Status: DC
  Administered 2018-12-31 – 2019-01-06 (×7): 6 via TOPICAL

## 2018-12-30 MED ORDER — LACTULOSE 10 GM/15ML PO SOLN
20.0000 g | Freq: Once | ORAL | Status: AC
  Administered 2018-12-30: 20 g via ORAL
  Filled 2018-12-30: qty 30

## 2018-12-30 MED ORDER — GLIMEPIRIDE 4 MG PO TABS
4.0000 mg | ORAL_TABLET | Freq: Every day | ORAL | Status: DC
  Administered 2018-12-30: 4 mg via ORAL
  Filled 2018-12-30 (×3): qty 1

## 2018-12-30 MED ORDER — INSULIN GLARGINE 100 UNIT/ML ~~LOC~~ SOLN
12.0000 [IU] | Freq: Every day | SUBCUTANEOUS | Status: DC
  Administered 2018-12-30 – 2018-12-31 (×2): 12 [IU] via SUBCUTANEOUS
  Filled 2018-12-30 (×2): qty 0.12

## 2018-12-30 MED ORDER — ACETAMINOPHEN 325 MG PO TABS
650.0000 mg | ORAL_TABLET | Freq: Four times a day (QID) | ORAL | Status: DC | PRN
  Administered 2018-12-30 – 2018-12-31 (×3): 650 mg via ORAL
  Filled 2018-12-30 (×4): qty 2

## 2018-12-30 MED ORDER — DEXAMETHASONE 6 MG PO TABS
40.0000 mg | ORAL_TABLET | Freq: Every day | ORAL | Status: DC
  Administered 2018-12-30: 40 mg via ORAL
  Filled 2018-12-30: qty 1
  Filled 2018-12-30: qty 6

## 2018-12-30 MED ORDER — POLYETHYLENE GLYCOL 3350 17 G PO PACK: 17.0000 g | PACK | Freq: Every day | ORAL | Status: DC | PRN

## 2018-12-30 MED ORDER — FOLIC ACID 1 MG PO TABS: 4.0000 mg | ORAL_TABLET | Freq: Once | ORAL | Status: AC

## 2018-12-30 MED ORDER — ACETAMINOPHEN 325 MG PO TABS
650.0000 mg | ORAL_TABLET | Freq: Once | ORAL | Status: AC
  Administered 2018-12-30: 10:00:00 650 mg via ORAL

## 2018-12-30 MED ORDER — DIPHENHYDRAMINE HCL 25 MG PO CAPS
25.0000 mg | ORAL_CAPSULE | Freq: Once | ORAL | Status: AC
  Administered 2018-12-30: 25 mg via ORAL
  Filled 2018-12-30: qty 1

## 2018-12-30 MED ORDER — FOLIC ACID 1 MG PO TABS
5.0000 mg | ORAL_TABLET | Freq: Every day | ORAL | Status: DC
  Administered 2018-12-31 – 2019-01-08 (×9): 5 mg via ORAL
  Filled 2018-12-30 (×10): qty 5

## 2018-12-30 MED ORDER — FOLIC ACID 1 MG PO TABS
1.0000 mg | ORAL_TABLET | Freq: Every day | ORAL | Status: DC
  Administered 2018-12-30: 1 mg via ORAL
  Filled 2018-12-30 (×2): qty 1

## 2018-12-30 NOTE — Consult Note (Addendum)
[place]  [phone] [phone]    [place]  Referring MD:  Dr. Estill Cotta  Reason for Referral: Anemia  HPI: Hannah Dickson is a 53 year old Spanish speaking female with a past medical history significant for autoimmune hemolytic anemia, diabetes, and hypertension. She presented to the ER with a 3-4 day history of abdominal pain, nausea, and vomiting. No hematemesis noted. Pain is primarily in the right upper quadrant. Daughter also reported to ER that she had yellowing of her eyes which started 3 days prior to admission. The patient was tachycardic and hypotensive in the ER. Labs significant for a hemoglobin 3.2, WBC 16.5, and platelets 521,0001.  Elevated total bilirubin 6.5, ALT 58. Glucose 418. Stool for occult blood negative. CT of the abdomen/pelvis did not show any acute abnormality. Portable chest x-ray showed linear opacity in the left lower lung field-atelectasis or airspace consolidation..  2 units of blood ordered for transfusion in the ED at [facility], but blood has to come from [place] long because of her antibodies hence delaying transfusion. EDP talked to oncology on call Dr. Delton Coombes who recommended IV dexamethasone. She received 1 dose of this on 12/29/2018. The patient was found to be COVID-19 positive and was transferred to [facility] with plans to transfer to [facility] when bed available. Additional labs obtained this admission include Vitamin B12 >7,500, Folate 27.5, Iron 197, TIBC 238, %sat 83%, UIBC 41.   The patient is followed at [facility] for her hemolytic anemia. Last visit with Dr. Hart Robinsons was in 04/2018. According to his note, the patient underwent a bone marrow biopsy on 01/17/2016 (see below for full report). The findings are most consistent with a reactive process given the patient's known DAT positive hemolytic anemia. At the time of her last visit, she had no evidence of hemolysis. CBC on  that date showed a WBC of 6.2, hemoglobin of 12.9, platelet count of 229,000, and ANC 4.3., Ferritin 1,139, Absolute reticulocyte count 44.2, Immature Retic Fraction 41.6,  LDH 371, Direct bilirubin 1.4, Indirect Bilirubin 5.5, Coombs positive.   ED provider talked to oncology Dr. Sabra Heck.  No beds at [place] to call daily, if any beds open up she may be transferred.  At the time of admission he had no additional recommendations- said patient had received appropriate dose of steroids, and recommends blood transfusion.  A summary of her prior treatment is as follows: -Titrated down to 10 mg prednisone as outpatient in Sept 2017 -Dose decreased to 5 mg prednisone daily in November 2017 with stable counts. -Dose reduce her to 5 mg every other day on 07/16/16 with slight increase in hemolytic labs but Hb maintaining in 11 range.  -STOPPED maintenance steroids ~April 2018  -Pulse Dex 40 mg x 4, started 6/1 due to increased retic (4.7) vs and drop in Hb (8.5) on labs from 11/08/16 with Hb improved to 9.5 with retic of 2.7 on 12/03/16.  -Given the limited amount of time she has maintains a response off steroids, and issues with hyperglycemia and weight gain with steroids, we elected to give Rituxan. She received this on 8/17 and 02/13/17. Had prompt improvement in HGB level and was able to taper off steroids shortly thereafter without e/o hemolysis. Has remained without evidence of hemolysis since in follow up.   The patient's visit today was conducted via E link.  A Spanish video interpreter was used during the visit.  The patient's  nurse was in the room at the time of my visit.  Unfortunately, the patient was sleeping and unable to awaken to answer my questions.  No bleeding is currently noted.  The above history was obtained from her chart.  Hematology was asked to see the patient to make recommendations regarding her anemia.  Past Medical History:  Diagnosis Date   Acquired  hemolytic anemia ([place]) 01/08/2016   Allergy    Anemia    Arthritis    Diabetes mellitus without complication (HCC)    Hypertension    Shortness of breath dyspnea    slight according to patient due to anemia   Visual changes   :    Past Surgical History:  Procedure Laterality Date   ANKLE SURGERY Left 2006   CESAREAN SECTION    :   CURRENT MEDS: Current Facility-Administered Medications  Medication Dose Route Frequency Provider Last Rate Last Dose   0.9 %  sodium chloride infusion   Intravenous Continuous Emokpae, Ejiroghene E, MD 100 mL/hr at 12/30/18 0600     acetaminophen (TYLENOL) tablet 650 mg  650 mg Oral Q6H PRN Emokpae, Ejiroghene E, MD       Or   acetaminophen (TYLENOL) suppository 650 mg  650 mg Rectal Q6H PRN Emokpae, Ejiroghene E, MD       atorvastatin (LIPITOR) tablet 20 mg  20 mg Oral Daily Emokpae, Ejiroghene E, MD   20 mg at 12/30/18 3300   B-complex with vitamin C tablet 1 tablet  1 tablet Oral Daily Maryanna Shape, NP       [START ON 12/31/2018] Chlorhexidine Gluconate Cloth 2 % PADS 6 each  6 each Topical Daily Rai, Ripudeep K, MD       dexamethasone (DECADRON) tablet 40 mg  40 mg Oral Daily Curcio, Kristin R, NP       famotidine (PEPCID) IVPB 20 mg premix  20 mg Intravenous Once Maryanna Shape, NP   Stopped at 76/22/63 3354   folic acid (FOLVITE) tablet 1 mg  1 mg Oral Daily Emokpae, Ejiroghene E, MD   1 mg at 12/30/18 0914   glimepiride (AMARYL) tablet 4 mg  4 mg Oral Daily Emokpae, Ejiroghene E, MD   4 mg at 12/30/18 5625   insulin aspart (novoLOG) injection 0-15 Units  0-15 Units Subcutaneous Q4H Emokpae, Ejiroghene E, MD   11 Units at 12/30/18 0804   ondansetron (ZOFRAN) injection 4 mg  4 mg Intravenous Q6H PRN Rai, Ripudeep K, MD       pantoprazole (PROTONIX) injection 40 mg  40 mg Intravenous Q24H Emokpae, Ejiroghene E, MD   40 mg at 12/29/18 2312   polyethylene glycol (MIRALAX / GLYCOLAX) packet 17 g  17 g Oral Daily PRN  Emokpae, Ejiroghene E, MD       sodium chloride 0.9 % bolus 500 mL  500 mL Intravenous Once Emokpae, Ejiroghene E, MD          No Known Allergies:  Family History  Problem Relation Age of Onset   Diabetes Mother    Cancer Father   :  Social History   Socioeconomic History   Marital status: Married    Spouse name: Not on file   Number of children: Not on file   Years of education: Not on file   Highest education level: Not on file  Occupational History   Not on file  Social Needs   Financial resource strain: Not on file   Food insecurity  Worry: Not on file    Inability: Not on file   Transportation needs    Medical: Not on file    Non-medical: Not on file  Tobacco Use   Smoking status: Never Smoker   Smokeless tobacco: Never Used  Substance and Sexual Activity   Alcohol use: No   Drug use: No   Sexual activity: Never    Birth control/protection: Abstinence  Lifestyle   Physical activity    Days per week: Not on file    Minutes per session: Not on file   Stress: Not on file  Relationships   Social connections    Talks on phone: Not on file    Gets together: Not on file    Attends religious service: Not on file    Active member of club or organization: Not on file    Attends meetings of clubs or organizations: Not on file    Relationship status: Not on file   Intimate partner violence    Fear of current or ex partner: Not on file    Emotionally abused: Not on file    Physically abused: Not on file    Forced sexual activity: Not on file  Other Topics Concern   Not on file  Social History Narrative   Not on file  :  REVIEW OF SYSTEMS: A comprehensive 14 point review of systems could not be obtained secondary to the patient's condition.  Exam: Patient Vitals for the past 24 hrs:  BP Temp Temp src Pulse Resp SpO2 Height Weight  12/30/18 0800 103/67 -- -- (!) 119 -- 100 % -- --  12/30/18 0700 (!) 93/48 -- -- (!) 110 (!) 32 100 %  -- --  12/30/18 0618 (!) 104/52 97.6 F (36.4 C) Oral (!) 115 (!) 27 100 % -- --  12/30/18 0448 91/63 -- -- (!) 119 (!) 25 100 % -- --  12/30/18 0400 97/61 -- -- (!) 124 (!) 24 100 % -- --  12/30/18 0331 -- -- -- -- (!) 26 -- -- --  12/30/18 0330 (!) 90/48 -- -- (!) 124 (!) 26 100 % -- --  12/30/18 0230 (!) 100/59 -- -- (!) 117 (!) 28 100 % -- --  12/30/18 0200 (!) 91/56 -- -- (!) 120 (!) 28 100 % -- --  12/30/18 0130 (!) 82/51 -- -- (!) 121 (!) 22 100 % -- --  12/30/18 0100 (!) 81/40 -- -- (!) 115 (!) 25 100 % -- --  12/30/18 0030 (!) 90/54 -- -- (!) 118 18 100 % -- --  12/30/18 0014 98/61 -- -- (!) 123 (!) 29 100 % -- --  12/30/18 0000 (!) 82/53 -- -- (!) 120 (!) 24 100 % -- --  12/29/18 2330 (!) 81/55 -- -- (!) 117 (!) 27 100 % -- --  12/29/18 2300 97/62 -- -- (!) 116 (!) 30 100 % -- --  12/29/18 2230 (!) 85/47 -- -- (!) 116 (!) 27 97 % -- --  12/29/18 2200 (!) 92/55 -- -- (!) 118 18 99 % -- --  12/29/18 2130 (!) 81/45 -- -- (!) 115 (!) 28 99 % -- --  12/29/18 2100 (!) 82/47 -- -- (!) 116 (!) 22 100 % -- --  12/29/18 2051 (!) 84/51 -- -- (!) 120 (!) 23 99 % -- --  12/29/18 2048 (!) 92/57 -- -- (!) 116 (!) 29 97 % -- --  12/29/18 2043 (!) 83/46 -- -- (!) 115 10 100 % -- --  12/29/18 2041 (!) 77/50 -- -- (!) 117 18 97 % -- --  12/29/18 2030 (!) 82/51 -- -- (!) 115 (!) 30 97 % -- --  12/29/18 2000 (!) 85/44 -- -- (!) 116 -- 98 % -- --  12/29/18 1930 100/61 -- -- (!) 118 -- 98 % -- --  12/29/18 1900 (!) 92/51 -- -- (!) 118 -- 99 % -- --  12/29/18 1830 (!) 94/58 -- -- (!) 117 (!) 29 99 % -- --  12/29/18 1814 -- -- -- (!) 120 (!) 22 96 % -- --  12/29/18 1807 (!) 99/57 -- -- (!) 129 (!) 28 95 % -- --  12/29/18 1800 (!) 79/42 -- -- (!) 115 (!) 25 100 % -- --  12/29/18 1745 -- -- -- (!) 118 (!) 29 100 % -- --  12/29/18 1739 (!) 95/50 -- -- (!) 123 (!) 26 100 % -- --  12/29/18 1736 (!) 79/38 -- -- (!) 131 (!) 24 100 % -- --  12/29/18 1733 (!) 85/46 -- -- (!) 117 (!) 25 100 % -- --   12/29/18 1732 (!) 86/41 -- -- (!) 116 (!) 25 98 % -- --  12/29/18 1730 (!) 83/40 -- -- (!) 115 14 99 % -- --  12/29/18 1700 (!) 90/51 -- -- (!) 117 (!) 24 99 % -- --  12/29/18 1630 102/67 -- -- -- (!) 27 -- -- --  12/29/18 1600 (!) 98/37 -- -- -- (!) 24 -- -- --  12/29/18 1530 110/66 -- -- -- (!) 28 -- -- --  12/29/18 1500 116/66 -- -- -- (!) 28 -- -- --  12/29/18 1347 -- -- -- -- -- -- '4\' 10"'$  (1.473 m) 150 lb (68 kg)  12/29/18 1346 109/61 99.7 F (37.6 C) Oral (!) 122 16 98 % -- --    General: Resting in bed, no distress, unable to awaken to answer my questions.   Eyes: Jaundice noted.    LABS:  Lab Results  Component Value Date   WBC 16.5 (H) 12/29/2018   HGB 3.2 (LL) 12/29/2018   HCT 9.5 (L) 12/29/2018   PLT 521 (H) 12/29/2018   GLUCOSE 418 (H) 12/29/2018   ALT 58 (H) 12/29/2018   AST 31 12/29/2018   NA 129 (L) 12/29/2018   K 4.1 12/29/2018   CL 97 (L) 12/29/2018   CREATININE 0.77 12/29/2018   BUN 19 12/29/2018   CO2 18 (L) 12/29/2018   INR 1.28 01/08/2016   HGBA1C 7.5 (H) 01/08/2016    Ct Abdomen Pelvis W Contrast  Result Date: 12/29/2018 CLINICAL DATA:  Pt has been nauseous for the last 4 days. Has thrown up 3-4 times in the last 24 hours. Is having abdominal pain and is jaundiced. No history of liver disease. EXAM: CT ABDOMEN AND PELVIS WITH CONTRAST TECHNIQUE: Multidetector CT imaging of the abdomen and pelvis was performed using the standard protocol following bolus administration of intravenous contrast. CONTRAST:  147m OMNIPAQUE IOHEXOL 300 MG/ML  SOLN COMPARISON:  None. FINDINGS: Lower chest: No acute abnormality. Hepatobiliary: No focal liver abnormality is seen. No gallstones, gallbladder wall thickening, or biliary dilatation. Pancreas: Unremarkable. No pancreatic ductal dilatation or surrounding inflammatory changes. Spleen: Normal in size without focal abnormality. Adrenals/Urinary Tract: Adrenal glands are unremarkable. Kidneys are normal, without renal  calculi, focal lesion, or hydronephrosis. Bladder is unremarkable. Stomach/Bowel: Stomach is within normal limits. Appendix appears normal. No evidence of bowel wall thickening, distention, or inflammatory changes. Vascular/Lymphatic: No significant vascular findings are  present. No enlarged abdominal or pelvic lymph nodes. Reproductive: Uterus and bilateral adnexa are unremarkable. Other: No abdominal wall hernia or abnormality. No abdominopelvic ascites. Musculoskeletal: No acute osseous abnormality. No aggressive osseous lesion. Bilateral facet arthropathy at L5-S1. IMPRESSION: 1. No acute abdominal or pelvic pathology. 2. Normal appendix. Electronically Signed   By: Kathreen Devoid   On: 12/29/2018 16:47   Dg Chest Portable 1 View  Result Date: 12/29/2018 CLINICAL DATA:  Nausea and emesis. Shortness of breath and fever. EXAM: PORTABLE CHEST 1 VIEW COMPARISON:  December 29, 2018 FINDINGS: Cardiomediastinal silhouette is normal. Mediastinal contours appear intact. Low lung volumes. Linear opacity in the left lower lung field. Osseous structures are without acute abnormality. Soft tissues are grossly normal. IMPRESSION: 1. Low lung volumes. 2. Linear opacity in the left lower lung field may represent atelectasis or airspace consolidation. Electronically Signed   By: Fidela Salisbury M.D.   On: 12/29/2018 18:41   PATHOLOGY: ACCESSION NUMBER: [id number] RECEIVED: 01/16/2016 ORDERING PHYSICIAN: RODWIGE JACQUES DESNOYERS , MD PATIENT NAME: Hannah Dickson BONE MARROW REPORT  Bone Marrow (BM) and Peripheral Blood (PB) FINAL PATHOLOGIC DIAGNOSIS BONE MARROW ([id number]): Markedly hypercellular bone marrow with erythroid and megakaryocytic hyperplasia and no increase in blasts (<1%) (see comment):  Comment: The aspirate smears contain particles and are adequate for evaluation. Megakaryocytes are mildly increased with rare hyperlobate forms seen. Erythroid precursors are increased with nuclear budding  and megaloblastic changes. Myeloid precursors appear normal. There is no increase in blasts, lymphocytes or plasma cells. Prussian blue iron stain shows iron stores with no ring sideroblasts.  The clot section and core biopsy are hypercellular (80-90%) with increased erythropoiesis and megakaryocytes. No increase in immature cells is seen. No atypical lymphoid aggregates are identified. Immunostain for CD34 reveals no increase in blasts. CD117 is positive in scattered cells. E cadherin stains erythroid cells and myeloperoxidase stains myeloid precursors. The peripheral blood shows pancytopenia. The red cells show increased polychromasia, basophilic stippling and increased spherocytes. A mild left-shift of the neutrophils is seen.  Flow cytometric analysis showed no increase in blasts.  The findings are most consistent with a reactive process given the patient's known DAT positive hemolytic anemia.  I have personally reviewed the slides and/or other related materials referenced, and have edited the report as part of my pathologic assessment and final interpretation.  Electronically Signed Out By:  Irena Reichmann , MD8/07/2015 15:27:24   ASSESSMENT AND PLAN:   This is a 53 year old female with  1.  Hemolytic anemia- labs and outside records have been reviewed.  The patient has a history of autoimmune hemolytic anemia and has been followed by hematology at [facility].  Her prior treatment as outlined above.  Hemolysis likely triggered by acute infection from COVID 19. -Recommend packed red blood cell transfusion for hemoglobin less than 7.0.  She is due for 2 units of packed red blood cells today.  She is noted to have antibodies on her type and screen and will premedicate her with Tylenol, Benadryl, Pepcid, and dexamethasone. -Recheck CBC 1 hour posttransfusion. -We will continue dexamethasone 40 mg daily for total of 4 days. -Increase folic acid to 5 mg daily and add  vitamin B complex. -We will add on SPEP with IFE to labs. -If she does not respond to the above treatments, we can consider giving her IVIG which is less likely to be effective in hemolytic anemia.  However, other immunosuppressive agents will increase the risk of more severe symptoms from COVID-19. -Recommend daily CBC,  CMET, LDH, and reticulocyte count. -If a bed becomes available at [facility], consider transfer to their facility as her primary hematologist is there.  2.  COVID-19- treatment per hospitalist.    Thank you for this referral.  Mikey Bussing, DNP, AGPCNP-BC, AOCNP   ADDENDUM  .Patient's chart and outside and in house records were Personally and independently interviewed. Ppatient was not examined personally in the setting of active COVID 19 infection and need to conserve PPE. PE findings by hospitalist noted. All relevant elements of the history of present illness were reviewed in details and an assessment and plan was created. All elements of the patient's history of present illness , assessment and plan were discussed in details with Mikey Bussing DNP. The above documentation reflects our combined findings assessment and plan.  . CBC Latest Ref Rng & Units 12/29/2018 01/10/2016 01/09/2016  WBC 4.0 - 10.5 K/uL 16.5(H) 2.9(L) 5.3  Hemoglobin 12.0 - 15.0 g/dL 3.2(LL) 7.5(L) 7.9(L)  Hematocrit 36.0 - 46.0 % 9.5(L) 23.4(L) 24.1(L)  Platelets 150 - 400 K/uL 521(H) 82(L) 79(L)   . CMP Latest Ref Rng & Units 12/29/2018 12/29/2018 01/10/2016  Glucose 70 - 99 mg/dL - 418(H) 152(H)  BUN 6 - 20 mg/dL - 19 12  Creatinine 0.44 - 1.00 mg/dL - 0.77 0.37(L)  Sodium 135 - 145 mmol/L - 129(L) 136  Potassium 3.5 - 5.1 mmol/L - 4.1 4.0  Chloride 98 - 111 mmol/L - 97(L) 109  CO2 22 - 32 mmol/L - 18(L) 22  Calcium 8.9 - 10.3 mg/dL - 8.3(L) 8.2(L)  Total Protein 6.5 - 8.1 g/dL - 6.8 -  Total Bilirubin 0.3 - 1.2 mg/dL 6.9(H) 6.5(H) -  Alkaline Phos 38 - 126 U/L - 99 -  AST 15 - 41 U/L -  31 -  ALT 0 - 44 U/L - 58(H) -   . 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 Patient follows with Dr. Hart [facility]insons at [facility]. She has previously been steroid responsive and has also received Rituxan in the past. Has been off steroids since April 2018 and was last seen by her primary hematologist in November 2019 when she was apparently in remission.  Relapse likely triggered/worsened by her active COVID-19 infection. Hemoglobin level dangerously low at 3.2 with difficult PRBC availability due to antibodies. Patient has relative reticulocytopenia likely due to viral bone marrow suppression that appears to be worsening her anemia. No evidence of overt bleeding.  2) active COVID-19 infection.  Patient Active Problem List   Diagnosis Date Noted   Acute metabolic encephalopathy 82/50/0370   Acute hemolytic anemia (HCC) 12/29/2018   Warm reactive antibody ([place]) 01/09/2016   Hypotension 01/09/2016   Hyponatremia 01/09/2016   Hyperbilirubinemia 01/09/2016   Thrombocytopenia ([place]) 01/09/2016   Leukopenia 01/09/2016   Symptomatic anemia 01/09/2016   Acquired hemolytic anemia ([place]) 01/08/2016   Autoimmune hemolytic anemia (HCC) 12/22/2015   Optic neuritis    Visual loss 02/17/2015   Type 2 diabetes mellitus, uncontrolled ([place]) 02/17/2015   Plan -We will do induction steroids with dexamethasone 40 mg IV daily x4 doses followed by prednisone taper starting at 60 mg p.o. daily. -Will need close monitoring and management of her hyperglycemia with known diabetes on high-dose steroids.  Appreciate help by hospitalist for this. -B complex and high-dose folic acid to support accelerated erythropoiesis. -  No evidence of bleeding would need close attention to VTE prophylaxis in the setting of bedbound status, acute hemolysis,  COVID-19 infection with increased risk of thrombosis and high-dose steroids which would also increase this risk. -Not a candidate for Rituxan at this time with active COVID-19 infection or other immunosuppressive drugs. -Will need PRBC transfusions to maintain hemoglobin of more than 7.  Will need to use available type specific blood with the closest match with aggressive pretransfusion premeds including steroids, Tylenol, Benadryl and Pepcid-especially if hemoglobin less than 5.  If more time is available daily to try to get extended phenotype matched blood. -Monitor CBC with differential and reticulocyte count twice daily and CMP daily. -Transfer to [facility] for continued cares with the patient's primary hematologist if possible. -Management of COVID-19 as per hospitalist/critical care team. -Though IVIG is not as effective as an ITP would consider this as adjunctive therapy if the patient is requiring significant blood transfusions in the next 24-48 hours @ '500mg'$ /kg daily for upto 4 days -will continue to watch labs and follow peripherally. -if beds not available at [facility] okay to transfer to [facility] ([place] the designated COVID 19 facility in [place]. -may call me @ [phone] with any additional hematology specific questions.  Sullivan Lone MD MS

## 2018-12-30 NOTE — ED Notes (Signed)
Per staff at [facility] the blood is currently in transit and will be at their campus by 0430. Primary RN notified.

## 2018-12-30 NOTE — Progress Notes (Signed)
[facility]                                                                              Patient Demographics  Hannah Dickson, is a 53 y.o. female, DOB - 10/07/1965, NWG:[id number]  Admit date - 12/29/2018   Admitting Physician Ejiroghene Wendall StadeE Emokpae, MD  Outpatient Primary MD for the patient is Karam, Renee RamusPhillip Jerome, MD  Outpatient specialists:   LOS - 1  days   Medical records reviewed and are as summarized below:    Chief Complaint  Patient presents with   Nausea       Brief summary   Patient is a 53 year old Spanish-speaking female with autoimmune hemolytic anemia, diabetes mellitus, hypertension presented to ED with nausea, multiple episodes of vomiting in the last 3 to 4 days, no hematemesis.  Abdominal pain involves her right upper abdomen, patient's daughter noticed yellow eyes for about 3 days PTA.  Patient denied any difficulty breathing, coughing, no diarrhea or urinary symptoms.  No melena or hematochezia.  ED Course: Tachycardic to 129, hypotensive blood pressure systolic 79-109.  Hemoglobin 3.2.  Leukocytosis 16.5, and platelets 521.  Elevated total bilirubin 6.5, ALT 58..  Mild elevated creatinine 0.7.  Glucose 418.  With normal anion gap of 14.  Stool FOBT negative.  UA with glucose and ketones.  CT pelvis with contrast negative for acute abnormality.  Portable chest x-ray shows linear opacity in the left lower lung field-atelectasis or airspace consolidation..  2 units of blood ordered for transfusion in the ED, [facility] because of her antibodies hence delaying transfusion. EDP talked to oncology on call Dr. Ellin SabaKatragadda who recommended IV dexamethasone COVID-19 test positive   Assessment & Plan    Principal Problem:   Acute hemolytic anemia (HCC) -Patient has a history of autoimmune hemolytic anemia, has been [facility] by hematology at [facility]. -Hemolysis likely triggered by acute infection  from COVID-19 -Hematology consulted, noted to have antibodies on type and screen, hence delay of packed RBC transfusion. -Packed RBC units obtained, started transfusion, will likely need 3-4 packed RBC transfusions, transfuse for hemoglobin less than 7. -Per hematology, continue dexamethasone 40 mg daily for total 4 days, increase folic acid to 5 mg daily, add vitamin B complex. If no response to above treatments, consider IVIG, less likely to be effective in hemolytic anemia. -Recommended daily CBC, CMET, LDH, reticulocyte leukocyte count -[facility] beds [facility] available [facility]  Active Problems: COVID-19 pneumonia -Chest x-ray showed linear opacity in the left lower lung atelectasis or airspace disease.  Patient denied any acute respiratorysymptoms -Continue daily labs including ferritin, CRP, supportive treatment for now -O2 sats 99% on room air    Type 2 diabetes mellitus, uncontrolled (HCC) -CBGs uncontrolled likely due to steroids.  Hold oral hypoglycemics. -Hemoglobin A1c 6.4 on 7/14 -Added Lantus units daily, continue sliding scale insulin moderate every 4 hours  Acute metabolic encephalopathy -In a.m., was alert and oriented, at the time of my encounter somewhat lethargic, had received Benadryl prior to my encounter for packed RBC transfusion -Monitor closely, ammonia level 41, mildly elevated, will give 1  dose of lactulose    Hypotension -BP currently soft, likely due to ongoing hemolysis, hypovolemia due to multiple episodes of vomiting PTA -Continue to hold lisinopril -Continue packed RBC transfusion    Hyperbilirubinemia -Likely due to acute hemolysis, follow closely  Hyponatremia -Sodium 129 at the time of admission, likely due to multiple episodes of vomiting -Recheck Cmet   Code Status: full  DVT Prophylaxis:  SCD's Family Communication: Discussed in detail with the patient, all imaging results, lab results explained to the patient's daughter on the  phone  Disposition Plan: I called [facility], no beds available still.   Likely transfer to [facility] if no beds are available at [facility].  Discussed with [facility] MD, Dr Aileen Fass, okay to transfer if cleared by hematology oncology, Dr. Irene Limbo  Time Spent in minutes   35 minutes  Procedures:  None  Consultants:   Hematology oncology, Dr. Irene Limbo  Antimicrobials:   Anti-infectives (From admission, onward)   None         Medications  Scheduled Meds:  atorvastatin  20 mg Oral Daily   B-complex with vitamin C  1 tablet Oral Daily   [START ON 12/31/2018] Chlorhexidine Gluconate Cloth  6 each Topical Daily   dexamethasone  40 mg Oral Daily   [START ON 12/01/735] folic acid  5 mg Oral Daily   glimepiride  4 mg Oral Daily   insulin aspart  0-15 Units Subcutaneous Q4H   insulin glargine  12 Units Subcutaneous Daily   pantoprazole (PROTONIX) IV  40 mg Intravenous Q24H   Continuous Infusions:  sodium chloride Stopped (12/30/18 1113)   sodium chloride     PRN Meds:.acetaminophen **OR** acetaminophen, ondansetron (ZOFRAN) IV, polyethylene glycol      Subjective:   Hannah Dickson was seen and examined today.  Video interpreter used for examination.  Somewhat lethargic, had received Benadryl prior to my encounter.  Per RN, was alert and oriented prior to the Benadryl.  Follows some commands, states no pain.    Objective:   Vitals:   12/30/18 1238 12/30/18 1240 12/30/18 1245 12/30/18 1300  BP:   (!) 96/56 (!) 107/57  Pulse:  (!) 113 (!) 108 (!) 110  Resp:  (!) 24 (!) 28 (!) 29  Temp: 97.6 F (36.4 C) 97.6 F (36.4 C)    TempSrc: Oral Oral    SpO2:  99% 99% 98%  Weight:      Height:        Intake/Output Summary (Last 24 hours) at 12/30/2018 1328 Last data filed at 12/30/2018 1240 Gross per 24 hour  Intake 1285.33 ml  Output --  Net 1285.33 ml     Wt Readings from Last 3 Encounters:  12/29/18 68 kg  01/10/16 62 kg  12/22/15 61.5 kg      Exam  General: Awake but lethargic, follows only some commands.  Eyes: icteric Sclera,  HEENT:    Cardiovascular: S1 S2 auscultated,  Regular rate and rhythm.  Respiratory: CTA B anteriorly no wheezing  Gastrointestinal: Soft, nontender, nondistended, + bowel sounds  Ext: no pedal edema bilaterally  Neuro: Able to raise her legs but intermittently follow commands  Musculoskeletal: No digital cyanosis, clubbing  Skin: No rashes  Psych: Lethargic   Data Reviewed:  I have personally reviewed following labs and imaging studies  Micro Results Recent Results (from the past 240 hour(s))  SARS Coronavirus 2 (CEPHEID - Performed in [facility] hospital lab), Hosp Order     Status: Abnormal  Collection Time: 12/29/18  3:22 PM   Specimen: Nasopharyngeal Swab  Result Value Ref Range Status   SARS Coronavirus 2 POSITIVE (A) NEGATIVE Final    Comment: RESULT CALLED TO, READ BACK BY AND VERIFIED WITH: WESTON,L ON 12/29/18 AT 1855 BY LOY,C (NOTE) If result is NEGATIVE SARS-CoV-2 target nucleic acids are NOT DETECTED. The SARS-CoV-2 RNA is generally detectable in upper and lower  respiratory specimens during the acute phase of infection. The lowest  concentration of SARS-CoV-2 viral copies this assay can detect is 250  copies / mL. A negative result does not preclude SARS-CoV-2 infection  and should not be used as the sole basis for treatment or other  patient management decisions.  A negative result may occur with  improper specimen collection / handling, submission of specimen other  than nasopharyngeal swab, presence of viral mutation(s) within the  areas targeted by this assay, and inadequate number of viral copies  (<250 copies / mL). A negative result must be combined with clinical  observations, patient history, and epidemiological information. If result is POSITIVE SARS-CoV-2 target nucleic acids are DETECTED.  The SARS-CoV-2 RNA is generally detectable in upper  and lower  respiratory specimens during the acute phase of infection.  Positive  results are indicative of active infection with SARS-CoV-2.  Clinical  correlation with patient history and other diagnostic information is  necessary to determine patient infection status.  Positive results do  not rule out bacterial infection or co-infection with other viruses. If result is PRESUMPTIVE POSTIVE SARS-CoV-2 nucleic acids MAY BE PRESENT.   A presumptive positive result was obtained on the submitted specimen  and confirmed on repeat testing.  While 2019 novel coronavirus  (SARS-CoV-2) nucleic acids may be present in the submitted sample  additional confirmatory testing may be necessary for epidemiological  and / or clinical management purposes  to differentiate between  SARS-CoV-2 and other Sarbecovirus currently known to infect humans.  If clinically indicated additional testing with an alternate test  methodology [phone]([id number]) i s advised. The SARS-CoV-2 RNA is generally  detectable in upper and lower respiratory specimens during the acute  phase of infection. The expected result is Negative. Fact Sheet for Patients:  BoilerBrush.com.cyhttps://www.fda.gov/media/136312/download Fact Sheet for Healthcare Providers: https://pope.com/https://www.fda.gov/media/136313/download This test is not yet approved or cleared by the [place] FDA and has been authorized for detection and/or diagnosis of SARS-CoV-2 by FDA under an Emergency Use Authorization (EUA).  This EUA will remain in effect (meaning this test can be used) for the duration of the COVID-19 declaration under Section 564(b)(1) of the Act, 21 U.S.C. section 360bbb-3(b)(1), unless the authorization is terminated or revoked sooner. Performed at [facility], [address] [place], [place] [address]   MRSA PCR Screening     Status: None   Collection Time: 12/30/18  6:53 AM   Specimen: Nasal Mucosa; Nasopharyngeal  Result Value Ref Range Status   MRSA by PCR NEGATIVE  NEGATIVE Final    Comment:        The GeneXpert MRSA Assay (FDA approved for NASAL specimens only), is one component of a comprehensive MRSA colonization surveillance program. It is not intended to diagnose MRSA infection nor to guide or monitor treatment for MRSA infections. Performed at [facility], [address] [place], [place] [address]     Radiology Reports Ct Abdomen Pelvis W Contrast  Result Date: 12/29/2018 CLINICAL DATA:  Pt has been nauseous for the last 4 days. Has thrown up 3-4 times in the last  24 hours. Is having abdominal pain and is jaundiced. No history of liver disease. EXAM: CT ABDOMEN AND PELVIS WITH CONTRAST TECHNIQUE: Multidetector CT imaging of the abdomen and pelvis was performed using the standard protocol following bolus administration of intravenous contrast. CONTRAST:  OMNIPAQUE IOHEXOL 300 MG/ML  SOLN COMPARISON:  None. FINDINGS: Lower chest: No acute abnormality. Hepatobiliary: No focal liver abnormality is seen. No gallstones, gallbladder wall thickening, or biliary dilatation. Pancreas: Unremarkable. No pancreatic ductal dilatation or surrounding inflammatory changes. Spleen: Normal in size without focal abnormality. Adrenals/Urinary Tract: Adrenal glands are unremarkable. Kidneys are normal, without renal calculi, focal lesion, or hydronephrosis. Bladder is unremarkable. Stomach/Bowel: Stomach is within normal limits. Appendix appears normal. No evidence of bowel wall thickening, distention, or inflammatory changes. Vascular/Lymphatic: No significant vascular findings are present. No enlarged abdominal or pelvic lymph nodes. Reproductive: Uterus and bilateral adnexa are unremarkable. Other: No abdominal wall hernia or abnormality. No abdominopelvic ascites. Musculoskeletal: No acute osseous abnormality. No aggressive osseous lesion. Bilateral facet arthropathy at L5-S1. IMPRESSION: 1. No acute abdominal or pelvic pathology. 2. Normal  appendix. Electronically Signed   By: Elige Ko   On: 12/29/2018 16:47   Dg Chest Portable 1 View  Result Date: 12/29/2018 CLINICAL DATA:  Nausea and emesis. Shortness of breath and fever. EXAM: PORTABLE CHEST 1 VIEW COMPARISON:  December 29, 2018 FINDINGS: Cardiomediastinal silhouette is normal. Mediastinal contours appear intact. Low lung volumes. Linear opacity in the left lower lung field. Osseous structures are without acute abnormality. Soft tissues are grossly normal. IMPRESSION: 1. Low lung volumes. 2. Linear opacity in the left lower lung field may represent atelectasis or airspace consolidation. Electronically Signed   By: Ted Mcalpine M.D.   On: 12/29/2018 18:41    Lab Data:  CBC: Recent Labs  Lab 12/29/18 1531  WBC 16.5*  NEUTROABS 12.5*  HGB 3.2*  HCT 9.5*  MCV 96.0  PLT 521*   Basic Metabolic Panel: Recent Labs  Lab 12/29/18 1459  NA 129*  K 4.1  CL 97*  CO2 18*  GLUCOSE 418*  BUN 19  CREATININE 0.77  CALCIUM 8.3*   GFR: Estimated Creatinine Clearance: 67.1 mL/min (by C-G formula based on SCr of 0.77 mg/dL). Liver Function Tests: Recent Labs  Lab 12/29/18 1459 12/29/18 1544  AST 31  --   ALT 58*  --   ALKPHOS 99  --   BILITOT 6.5* 6.9*  PROT 6.8  --   ALBUMIN 3.9  --    Recent Labs  Lab 12/29/18 1459  LIPASE 33   Recent Labs  Lab 12/30/18 1245  AMMONIA 41*   Coagulation Profile: No results for input(s): INR, PROTIME in the last 168 hours. Cardiac Enzymes: No results for input(s): CKTOTAL, CKMB, CKMBINDEX, TROPONINI in the last 168 hours. BNP (last 3 results) No results for input(s): PROBNP in the last 8760 hours. HbA1C: Recent Labs    12/29/18 1531  HGBA1C 6.4*   CBG: Recent Labs  Lab 12/30/18 0007 12/30/18 0012 12/30/18 0226 12/30/18 0759 12/30/18 1121  GLUCAP 409* 389* 375* 314* 313*   Lipid Profile: No results for input(s): CHOL, HDL, LDLCALC, TRIG, CHOLHDL, LDLDIRECT in the last 72 hours. Thyroid Function  Tests: No results for input(s): TSH, T4TOTAL, FREET4, T3FREE, THYROIDAB in the last 72 hours. Anemia Panel: Recent Labs    12/29/18 1544  VITAMINB12 >7,500*  FOLATE 27.5  FERRITIN 1,139*  TIBC 238*  IRON 197*  RETICCTPCT 4.3*   Urine analysis:    Component Value Date/Time  COLORURINE AMBER (A) 12/29/2018 1515   APPEARANCEUR CLEAR 12/29/2018 1515   LABSPEC 1.016 12/29/2018 1515   PHURINE 5.0 12/29/2018 1515   GLUCOSEU >=500 (A) 12/29/2018 1515   HGBUR NEGATIVE 12/29/2018 1515   BILIRUBINUR NEGATIVE 12/29/2018 1515   KETONESUR 5 (A) 12/29/2018 1515   PROTEINUR NEGATIVE 12/29/2018 1515   NITRITE NEGATIVE 12/29/2018 1515   LEUKOCYTESUR NEGATIVE 12/29/2018 1515     Deni Berti M.D. [facility] 12/30/2018, 1:28 PM  Pager: [phone] Between 7am to 7pm - call Pager - [phone]  After 7pm go to [email or web address] - password [email or web address]  Call night coverage person covering after 7pm

## 2018-12-30 NOTE — ED Notes (Signed)
Lelan Pons, from lab called to inform that [facility] blood bank has received the blood and wanted to know if they should send it here while pt was awaiting transport. Verified with [facility] dispatch that they are in route to pick pt up now. Lelan Pons reports it will take extra time to arrange courier transport- instructed Verdis Frederickson to leave the blood at [facility] since Karen Chafe is now in route.

## 2018-12-30 NOTE — Progress Notes (Signed)
Patient stood at bedside very dizzy saying she was nauseated. RN came into room and helped patient back to bed. Floor mats placed at bedside and a sign to remind patient to stay in the bed. Bed alarm on at this time.  Nausea medication given. Interpreter used to talk with patient.

## 2018-12-30 NOTE — ED Notes (Signed)
Updated pt and family that [facility] is on the way.

## 2018-12-30 NOTE — Progress Notes (Signed)
Inpatient Diabetes Program Recommendations  AACE/ADA: New Consensus Statement on Inpatient Glycemic Control (2015)  Target Ranges:  Prepandial:   less than 140 mg/dL      Peak postprandial:   less than 180 mg/dL (1-2 hours)      Critically ill patients:  140 - 180 mg/dL   Lab Results  Component Value Date   GLUCAP 313 (H) 12/30/2018   HGBA1C 7.5 (H) 01/08/2016    Review of Glycemic Control  Diabetes history: DM2 Outpatient Diabetes medications: metformin 1000 mg bid, Amaryl 4 mg QD Current orders for Inpatient glycemic control: Novolog 0-15 units Q4H, Amaryl 4 mg QD  On Decadron 40 mg QD  Inpatient Diabetes Program Recommendations:     Add Lantus 12 units Q24H  Will follow.  Thank you. Lorenda Peck, RD, LDN, CDE Inpatient Diabetes Coordinator [phone]

## 2018-12-30 NOTE — Progress Notes (Signed)
CRITICAL VALUE ALERT  Critical Value:  6.5 Hemoglobin  Date & Time Notied:  12/30/2018 2123  Provider Notified: On call [facility]  Orders Received/Actions taken:

## 2018-12-30 NOTE — Progress Notes (Signed)
Report called to [facility] at [facility], awaiting [facility]'s arrival.

## 2018-12-30 NOTE — Progress Notes (Signed)
[facility] patient placement- bed still not available at this time.

## 2018-12-30 NOTE — ED Notes (Signed)
while this nurse was in room with pt - putting pure wick on pt - pt daughter said she did not want to go to bathroom and leave her mom by herself. Explained to pt daughter that I would be with her mom until she got back. Pt daughter to bathroom and back to room. Pt daughter was given meal tray and beverage.

## 2018-12-30 NOTE — Progress Notes (Signed)
Patient arrived on the unit, transfer orders released. V/s stable. Dinner tray ordered. Patient notified her husband she was at [facility]. MD paged upon patients arrival.

## 2018-12-30 NOTE — ED Notes (Signed)
Compatible blood for patient is being ordered and transported from [place] to [facility] per Lelan Pons, who works in the lab at [facility].

## 2018-12-31 DIAGNOSIS — E1165 Type 2 diabetes mellitus with hyperglycemia: Secondary | ICD-10-CM

## 2018-12-31 DIAGNOSIS — D649 Anemia, unspecified: Secondary | ICD-10-CM

## 2018-12-31 LAB — TYPE AND SCREEN
ABO/RH(D): O POS
Antibody Screen: POSITIVE
DAT, IgG: POSITIVE
Unit division: 0
Unit division: 0
Unit division: 0
Unit division: 0

## 2018-12-31 LAB — BASIC METABOLIC PANEL
Anion gap: 11 (ref 5–15)
BUN: 23 mg/dL — ABNORMAL HIGH (ref 6–20)
CO2: 19 mmol/L — ABNORMAL LOW (ref 22–32)
Calcium: 8.7 mg/dL — ABNORMAL LOW (ref 8.9–10.3)
Chloride: 108 mmol/L (ref 98–111)
Creatinine, Ser: 0.61 mg/dL (ref 0.44–1.00)
GFR calc Af Amer: >60 mL/min (ref >60)
GFR calc non Af Amer: >60 mL/min (ref >60)
Glucose, Bld: 233 mg/dL — ABNORMAL HIGH (ref 70–99)
Potassium: 3.3 mmol/L — ABNORMAL LOW (ref 3.5–5.1)
Sodium: 138 mmol/L (ref 135–145)

## 2018-12-31 LAB — BPAM RBC
Blood Product Expiration Date: 202008072359
Blood Product Expiration Date: 202008132359
Blood Product Expiration Date: 202008132359
Blood Product Expiration Date: 202008212359
ISSUE DATE / TIME: 202007151039
ISSUE DATE / TIME: 202007151414
Unit Type and Rh: 5100
Unit Type and Rh: 5100
Unit Type and Rh: 5100
Unit Type and Rh: 9500

## 2018-12-31 LAB — CBC
HCT: 16.2 % — ABNORMAL LOW (ref 36.0–46.0)
Hemoglobin: 6.2 g/dL — CL (ref 12.0–15.0)
MCH: 38.5 pg — ABNORMAL HIGH (ref 26.0–34.0)
MCHC: 38.3 g/dL — ABNORMAL HIGH (ref 30.0–36.0)
MCV: 100.6 fL — ABNORMAL HIGH (ref 80.0–100.0)
Platelets: 250 10*3/uL (ref 150–400)
RBC: 1.61 MIL/uL — ABNORMAL LOW (ref 3.87–5.11)
RDW: 20.3 % — ABNORMAL HIGH (ref 11.5–15.5)
WBC: 23 10*3/uL — ABNORMAL HIGH (ref 4.0–10.5)
nRBC: 2.7 % — ABNORMAL HIGH (ref 0.0–0.2)

## 2018-12-31 LAB — GLUCOSE, CAPILLARY
Glucose-Capillary: 161 mg/dL — ABNORMAL HIGH (ref 70–99)
Glucose-Capillary: 182 mg/dL — ABNORMAL HIGH (ref 70–99)
Glucose-Capillary: 220 mg/dL — ABNORMAL HIGH (ref 70–99)
Glucose-Capillary: 269 mg/dL — ABNORMAL HIGH (ref 70–99)
Glucose-Capillary: 413 mg/dL — ABNORMAL HIGH (ref 70–99)
Glucose-Capillary: 454 mg/dL — ABNORMAL HIGH (ref 70–99)

## 2018-12-31 LAB — RETICULOCYTES
Immature Retic Fract: 49.4 % — ABNORMAL HIGH (ref 2.3–15.9)
RBC.: 1.61 MIL/uL — ABNORMAL LOW (ref 3.87–5.11)
Retic Count, Absolute: 69.7 10*3/uL (ref 19.0–186.0)
Retic Ct Pct: 4.3 % — ABNORMAL HIGH (ref 0.4–3.1)

## 2018-12-31 LAB — C-REACTIVE PROTEIN: CRP: 3 mg/dL — ABNORMAL HIGH (ref <1.0)

## 2018-12-31 LAB — PREPARE RBC (CROSSMATCH)

## 2018-12-31 LAB — FERRITIN: Ferritin: 1098 ng/mL — ABNORMAL HIGH (ref 11–307)

## 2018-12-31 LAB — HEMOGLOBIN AND HEMATOCRIT, BLOOD
HCT: 24.1 % — ABNORMAL LOW (ref 36.0–46.0)
Hemoglobin: 8.8 g/dL — ABNORMAL LOW (ref 12.0–15.0)

## 2018-12-31 LAB — GLUCOSE, RANDOM: Glucose, Bld: 438 mg/dL — ABNORMAL HIGH (ref 70–99)

## 2018-12-31 LAB — HIV ANTIBODY (ROUTINE TESTING W REFLEX): HIV Screen 4th Generation wRfx: NONREACTIVE

## 2018-12-31 LAB — LACTATE DEHYDROGENASE: LDH: 389 U/L — ABNORMAL HIGH (ref 98–192)

## 2018-12-31 MED ORDER — DEXAMETHASONE SODIUM PHOSPHATE 4 MG/ML IJ SOLN
40.0000 mg | Freq: Every day | INTRAMUSCULAR | Status: DC
  Filled 2018-12-31: qty 10

## 2018-12-31 MED ORDER — INSULIN ASPART 100 UNIT/ML ~~LOC~~ SOLN
0.0000 [IU] | Freq: Three times a day (TID) | SUBCUTANEOUS | Status: DC
  Administered 2018-12-31: 20 [IU] via SUBCUTANEOUS
  Administered 2019-01-01: 7 [IU] via SUBCUTANEOUS
  Administered 2019-01-01: 20 [IU] via SUBCUTANEOUS
  Administered 2019-01-01 – 2019-01-02 (×2): 15 [IU] via SUBCUTANEOUS
  Administered 2019-01-02: 20 [IU] via SUBCUTANEOUS
  Administered 2019-01-02: 08:00:00 4 [IU] via SUBCUTANEOUS

## 2018-12-31 MED ORDER — INSULIN ASPART 100 UNIT/ML ~~LOC~~ SOLN
0.0000 [IU] | Freq: Every day | SUBCUTANEOUS | Status: DC
  Administered 2018-12-31: 22:00:00 5 [IU] via SUBCUTANEOUS
  Administered 2019-01-01: 4 [IU] via SUBCUTANEOUS

## 2018-12-31 MED ORDER — INSULIN GLARGINE 100 UNIT/ML ~~LOC~~ SOLN: 30.0000 [IU] | Freq: Every day | SUBCUTANEOUS | Status: DC

## 2018-12-31 MED ORDER — INSULIN ASPART 100 UNIT/ML ~~LOC~~ SOLN
6.0000 [IU] | Freq: Three times a day (TID) | SUBCUTANEOUS | Status: DC
  Administered 2018-12-31 – 2019-01-02 (×7): 6 [IU] via SUBCUTANEOUS

## 2018-12-31 MED ORDER — DEXAMETHASONE SODIUM PHOSPHATE 4 MG/ML IJ SOLN: 40.0000 mg | Freq: Every day | INTRAMUSCULAR | Status: DC

## 2018-12-31 MED ORDER — POTASSIUM CHLORIDE CRYS ER 20 MEQ PO TBCR
40.0000 meq | EXTENDED_RELEASE_TABLET | Freq: Two times a day (BID) | ORAL | Status: AC
  Administered 2018-12-31 (×2): 40 meq via ORAL
  Filled 2018-12-31 (×2): qty 2

## 2018-12-31 MED ORDER — DIPHENHYDRAMINE HCL 50 MG/ML IJ SOLN
25.0000 mg | Freq: Once | INTRAMUSCULAR | Status: AC
  Administered 2018-12-31: 25 mg via INTRAVENOUS
  Filled 2018-12-31: qty 1

## 2018-12-31 MED ORDER — INSULIN ASPART 100 UNIT/ML ~~LOC~~ SOLN
10.0000 [IU] | Freq: Once | SUBCUTANEOUS | Status: AC
  Administered 2018-12-31: 22:00:00 10 [IU] via SUBCUTANEOUS

## 2018-12-31 MED ORDER — INSULIN GLARGINE 100 UNIT/ML ~~LOC~~ SOLN
10.0000 [IU] | Freq: Once | SUBCUTANEOUS | Status: AC
  Administered 2018-12-31: 22:00:00 10 [IU] via SUBCUTANEOUS
  Filled 2018-12-31: qty 0.1

## 2018-12-31 MED ORDER — DEXAMETHASONE SODIUM PHOSPHATE 10 MG/ML IJ SOLN
40.0000 mg | INTRAMUSCULAR | Status: AC
  Administered 2018-12-31 – 2019-01-01 (×2): 40 mg via INTRAVENOUS
  Filled 2018-12-31 (×2): qty 4

## 2018-12-31 MED ORDER — PNEUMOCOCCAL VAC POLYVALENT 25 MCG/0.5ML IJ INJ
0.5000 mL | INJECTION | INTRAMUSCULAR | Status: AC
  Administered 2019-01-06: 0.5 mL via INTRAMUSCULAR
  Filled 2018-12-31 (×2): qty 0.5

## 2018-12-31 MED ORDER — SODIUM CHLORIDE 0.9% IV SOLUTION: Freq: Once | INTRAVENOUS | Status: DC

## 2018-12-31 MED ORDER — DEXAMETHASONE 6 MG PO TABS
6.0000 mg | ORAL_TABLET | Freq: Every day | ORAL | Status: DC
  Administered 2019-01-02: 08:00:00 6 mg via ORAL
  Filled 2018-12-31 (×2): qty 1

## 2018-12-31 MED ORDER — INSULIN GLARGINE 100 UNIT/ML ~~LOC~~ SOLN
30.0000 [IU] | Freq: Two times a day (BID) | SUBCUTANEOUS | Status: DC
  Administered 2018-12-31: 18:00:00 30 [IU] via SUBCUTANEOUS
  Filled 2018-12-31 (×2): qty 0.3

## 2018-12-31 NOTE — Progress Notes (Signed)
[facility] PROGRESS NOTE    Progress Note  Hannah Dickson  WJX:[id number] DOB: [birth year] DOA: 12/29/2018 PCP: [name], MD     Brief Narrative:   Hannah Dickson is an 53 y.o. female Spanish-speaking only with past medical history of autoimmune hemolytic anemia, diabetes mellitus and hypertension who presents to the ED after 3 to 4 days of abdominal pain nausea and vomiting, she relates no hematemesis, the daughter also reports jaundice which started 3 days prior to admission.  She also relate difficulty breathing coughing and diarrhea.  She was found to be tachycardic hypotensive with a hemoglobin of 3.2 a white count of 16.5 and a platelet count of 521 total bilirubin 6.5.  CT scan of the abdomen and pelvis was negative for any acute abnormalities, portable chest x-ray showed linear opacity in the left lung field.  She was transfused 2 units of packed red blood cells.  Assessment/Plan:   Acute hemolytic anemia/  Warm reactive antibody ([place]): Hemolysis likely triggered by COVID-19.  Hematology oncology was consulted who recommended transfuse for hemoglobin of less than 7.  Is noted to have antibodies on her type and screen and was premedicated with Tylenol Benadryl Pepcid and dexamethasone. We will transfuse her 1 unit of packed red blood cells as her hemoglobin is 6.5. Oncology also recommended to continue dexamethasone 40 mg daily for 4 days, continue folic acid and O13 complex.  I have also order SPEP, if she does not respond to the above treatment they are recommending IVIG. He also recommended to check daily CBC C met LDH and reticulocyte count she has been followed at [facility] and has previously responded to rituximab and steroids.  He has been off steroids since 09/2016.  Retake count is relatively low to the degree of her hemoglobin. There is no obvious signs of overt bleeding. She is not a candidate for rituximab in the setting of COVID-19  infection.  COVID-19 viral pneumonia: Chest x-ray showed linear opacity in the left lung, the patient denies any respiratory symptoms. We will continue to check ferritin, CRP LDH and d-dimer daily.  D-dimer CRP and ferritin are pending today. He is satting greater than 95% on room air. She is on oral dexamethasone for her hemolytic anemia for 3 days 40 mg, will taper this down to 10 mg in 3 days. Currently on high-dose dexamethasone, when she is completed 4 days we will switch her to 6 mg orally of dexamethasone.  Type 2 diabetes mellitus, uncontrolled ([place]) On admission blood glucose was 400, her A1c was 6.4. She was started on long-acting insulin plus sliding scale.  DC glimepiride.  Acute metabolic encephalopathy: Acute medication induced due to Benadryl.  Hyponatremia: Check a basic metabolic panel today she was also hypochloremic, which points probably to mild hypokalemia, I am sure there is a component of pseudohyponatremia likely due to elevated blood glucose.  Resolved with IV fluid hydration.    Hypotension : Likely due to constant vomiting and decreased oral intake. She was on lisinopril at home which has been held.  Her renal function is preserved.  Hyperbilirubinemia Likely due to hemolysis.  DVT prophylaxis: SCD's Family Communication:daughter Disposition Plan/Barrier to D/C: once hemolysis controlled. Code Status:     Code Status Orders  (From admission, onward)         Start     Ordered   12/30/18 0238  Full code  Continuous     12/30/18 0237        Code Status History  Date Active Date Inactive Code Status Order ID Comments User Context   01/08/2016 1819 01/10/2016 2340 Full Code [id number]  Jani Gravel, MD Inpatient   02/17/2015 1714 02/20/2015 1555 Full Code [id number]  Kelvin Cellar, MD Inpatient   Advance Care Planning Activity        IV Access:    Peripheral IV   Procedures and diagnostic studies:   Ct Abdomen Pelvis W Contrast  Result  Date: 12/29/2018 CLINICAL DATA:  Pt has been nauseous for the last 4 days. Has thrown up 3-4 times in the last 24 hours. Is having abdominal pain and is jaundiced. No history of liver disease. EXAM: CT ABDOMEN AND PELVIS WITH CONTRAST TECHNIQUE: Multidetector CT imaging of the abdomen and pelvis was performed using the standard protocol following bolus administration of intravenous contrast. CONTRAST:  166m OMNIPAQUE IOHEXOL 300 MG/ML  SOLN COMPARISON:  None. FINDINGS: Lower chest: No acute abnormality. Hepatobiliary: No focal liver abnormality is seen. No gallstones, gallbladder wall thickening, or biliary dilatation. Pancreas: Unremarkable. No pancreatic ductal dilatation or surrounding inflammatory changes. Spleen: Normal in size without focal abnormality. Adrenals/Urinary Tract: Adrenal glands are unremarkable. Kidneys are normal, without renal calculi, focal lesion, or hydronephrosis. Bladder is unremarkable. Stomach/Bowel: Stomach is within normal limits. Appendix appears normal. No evidence of bowel wall thickening, distention, or inflammatory changes. Vascular/Lymphatic: No significant vascular findings are present. No enlarged abdominal or pelvic lymph nodes. Reproductive: Uterus and bilateral adnexa are unremarkable. Other: No abdominal wall hernia or abnormality. No abdominopelvic ascites. Musculoskeletal: No acute osseous abnormality. No aggressive osseous lesion. Bilateral facet arthropathy at L5-S1. IMPRESSION: 1. No acute abdominal or pelvic pathology. 2. Normal appendix. Electronically Signed   By: HKathreen Devoid  On: 12/29/2018 16:47   Dg Chest Portable 1 View  Result Date: 12/29/2018 CLINICAL DATA:  Nausea and emesis. Shortness of breath and fever. EXAM: PORTABLE CHEST 1 VIEW COMPARISON:  December 29, 2018 FINDINGS: Cardiomediastinal silhouette is normal. Mediastinal contours appear intact. Low lung volumes. Linear opacity in the left lower lung field. Osseous structures are without acute  abnormality. Soft tissues are grossly normal. IMPRESSION: 1. Low lung volumes. 2. Linear opacity in the left lower lung field may represent atelectasis or airspace consolidation. Electronically Signed   By: DFidela SalisburyM.D.   On: 12/29/2018 18:41     Medical Consultants:    None.  Anti-Infectives:   none  Subjective:    Hannah Dickson relates she feels slightly better today  Objective:    Vitals:   12/30/18 2000 12/31/18 0000 12/31/18 0300 12/31/18 0400  BP: (!) 112/93 108/72  131/81  Pulse: 88 78 74 69  Resp: (!) 24     Temp: 98.4 F (36.9 C)   98.4 F (36.9 C)  TempSrc: Oral   Oral  SpO2: 99% 93% 93% 95%  Weight:      Height:       SpO2: 95 % O2 Flow Rate (L/min): 2 L/min   Intake/Output Summary (Last 24 hours) at 12/31/2018 0737 Last data filed at 12/30/2018 1625 Gross per 24 hour  Intake 1561.66 ml  Output --  Net 1561.66 ml   Filed Weights   12/29/18 1347  Weight: 68 kg    Exam: General exam: In no acute distress. Respiratory system: Good air movement and clear to auscultation. Cardiovascular system: S1 & S2 heard, RRR. No JVD. Gastrointestinal system: Abdomen is nondistended, soft and nontender.  Central nervous system: Alert and oriented. No focal neurological deficits. Extremities: No pedal edema. Skin:  No rashes, lesions or ulcers Psychiatry: Judgement and insight appear normal. Mood & affect appropriate.    Data Reviewed:    Labs: Basic Metabolic Panel: Recent Labs  Lab 12/29/18 1459  NA 129*  K 4.1  CL 97*  CO2 18*  GLUCOSE 418*  BUN 19  CREATININE 0.77  CALCIUM 8.3*   GFR Estimated Creatinine Clearance: 67.1 mL/min (by C-G formula based on SCr of 0.77 mg/dL). Liver Function Tests: Recent Labs  Lab 12/29/18 1459 12/29/18 1544  AST 31  --   ALT 58*  --   ALKPHOS 99  --   BILITOT 6.5* 6.9*  PROT 6.8  --   ALBUMIN 3.9  --    Recent Labs  Lab 12/29/18 1459  LIPASE 33   Recent Labs  Lab  12/30/18 1245  AMMONIA 41*   Coagulation profile No results for input(s): INR, PROTIME in the last 168 hours. COVID-19 Labs  Recent Labs    12/29/18 1544  FERRITIN 1,139*  LDH 371*    Lab Results  Component Value Date   SARSCOV2NAA POSITIVE (A) 12/29/2018    CBC: Recent Labs  Lab 12/29/18 1531 12/30/18 1820  WBC 16.5* 25.9*  NEUTROABS 12.5* 20.7*  HGB 3.2* 6.5*  HCT 9.5* 18.1*  MCV 96.0 101.1*  PLT 521* 348   Cardiac Enzymes: No results for input(s): CKTOTAL, CKMB, CKMBINDEX, TROPONINI in the last 168 hours. BNP (last 3 results) No results for input(s): PROBNP in the last 8760 hours. CBG: Recent Labs  Lab 12/30/18 1121 12/30/18 1544 12/30/18 2101 12/31/18 0018 12/31/18 0411  GLUCAP 313* 355* 340* 269* 220*   D-Dimer: No results for input(s): DDIMER in the last 72 hours. Hgb A1c: Recent Labs    12/29/18 1531  HGBA1C 6.4*   Lipid Profile: No results for input(s): CHOL, HDL, LDLCALC, TRIG, CHOLHDL, LDLDIRECT in the last 72 hours. Thyroid function studies: No results for input(s): TSH, T4TOTAL, T3FREE, THYROIDAB in the last 72 hours.  Invalid input(s): FREET3 Anemia work up: Recent Labs    12/29/18 1544  VITAMINB12 >7,500*  FOLATE 27.5  FERRITIN 1,139*  TIBC 238*  IRON 197*  RETICCTPCT 4.3*   Sepsis Labs: Recent Labs  Lab 12/29/18 1531 12/30/18 1820  WBC 16.5* 25.9*   Microbiology Recent Results (from the past 240 hour(s))  SARS Coronavirus 2 (CEPHEID - Performed in [place] hospital lab), Hosp Order     Status: Abnormal   Collection Time: 12/29/18  3:22 PM   Specimen: Nasopharyngeal Swab  Result Value Ref Range Status   SARS Coronavirus 2 POSITIVE (A) NEGATIVE Final    Comment: RESULT CALLED TO, READ BACK BY AND VERIFIED WITH: WESTON,L ON 12/29/18 AT 1855 BY LOY,C (NOTE) If result is NEGATIVE SARS-CoV-2 target nucleic acids are NOT DETECTED. The SARS-CoV-2 RNA is generally detectable in upper and lower  respiratory specimens  during the acute phase of infection. The lowest  concentration of SARS-CoV-2 viral copies this assay can detect is 250  copies / mL. A negative result does not preclude SARS-CoV-2 infection  and should not be used as the sole basis for treatment or other  patient management decisions.  A negative result may occur with  improper specimen collection / handling, submission of specimen other  than nasopharyngeal swab, presence of viral mutation(s) within the  areas targeted by this assay, and inadequate number of viral copies  (<250 copies / mL). A negative result must be combined with clinical  observations, patient history, and epidemiological information. If result  is POSITIVE SARS-CoV-2 target nucleic acids are DETECTED.  The SARS-CoV-2 RNA is generally detectable in upper and lower  respiratory specimens during the acute phase of infection.  Positive  results are indicative of active infection with SARS-CoV-2.  Clinical  correlation with patient history and other diagnostic information is  necessary to determine patient infection status.  Positive results do  not rule out bacterial infection or co-infection with other viruses. If result is PRESUMPTIVE POSTIVE SARS-CoV-2 nucleic acids MAY BE PRESENT.   A presumptive positive result was obtained on the submitted specimen  and confirmed on repeat testing.  While 2019 novel coronavirus  (SARS-CoV-2) nucleic acids may be present in the submitted sample  additional confirmatory testing may be necessary for epidemiological  and / or clinical management purposes  to differentiate between  SARS-CoV-2 and other Sarbecovirus currently known to infect humans.  If clinically indicated additional testing with an alternate test  methodology [phone]) i s advised. The SARS-CoV-2 RNA is generally  detectable in upper and lower respiratory specimens during the acute  phase of infection. The expected result is Negative. Fact Sheet for Patients:   StrictlyIdeas.no Fact Sheet for Healthcare Providers: BankingDealers.co.za This test is not yet approved or cleared by the [place] FDA and has been authorized for detection and/or diagnosis of SARS-CoV-2 by FDA under an Emergency Use Authorization (EUA).  This EUA will remain in effect (meaning this test can be used) for the duration of the COVID-19 declaration under Section 564(b)(1) of the Act, 21 U.S.C. section 360bbb-3(b)(1), unless the authorization is terminated or revoked sooner. Performed at [facility], [address], [place], [place] [address]   MRSA PCR Screening     Status: None   Collection Time: 12/30/18  6:53 AM   Specimen: Nasal Mucosa; Nasopharyngeal  Result Value Ref Range Status   MRSA by PCR NEGATIVE NEGATIVE Final    Comment:        The GeneXpert MRSA Assay (FDA approved for NASAL specimens only), is one component of a comprehensive MRSA colonization surveillance program. It is not intended to diagnose MRSA infection nor to guide or monitor treatment for MRSA infections. Performed at [facility], [place] [address] [place], [place] [address]      Medications:    atorvastatin  20 mg Oral Daily   B-complex with vitamin C  1 tablet Oral Daily   Chlorhexidine Gluconate Cloth  6 each Topical Daily   dexamethasone  40 mg Oral Daily   folic acid  5 mg Oral Daily   glimepiride  4 mg Oral Daily   insulin aspart  0-15 Units Subcutaneous Q4H   insulin glargine  12 Units Subcutaneous Daily   pantoprazole (PROTONIX) IV  40 mg Intravenous Q24H   Continuous Infusions:  sodium chloride        LOS: 2 days   Charlynne Cousins  [facility]  12/31/2018, 7:37 AM

## 2018-12-31 NOTE — Progress Notes (Signed)
Inpatient Diabetes Program Recommendations  AACE/ADA: New Consensus Statement on Inpatient Glycemic Control (2015)  Target Ranges:  Prepandial:   less than 140 mg/dL      Peak postprandial:   less than 180 mg/dL (1-2 hours)      Critically ill patients:  140 - 180 mg/dL   Lab Results  Component Value Date   GLUCAP 161 (H) 12/31/2018   HGBA1C 6.4 (H) 12/29/2018    Review of Glycemic Control Results for Hannah Dickson, Hannah Dickson (MRN [id number]) as of 12/31/2018 11:26  Ref. Range 12/30/2018 15:44 12/30/2018 21:01 12/31/2018 00:18 12/31/2018 04:11 12/31/2018 07:38  Glucose-Capillary Latest Ref Range: 70 - 99 mg/dL 355 (H) 340 (H) 269 (H) 220 (H) 161 (H)   Diabetes history: DM2 Outpatient Diabetes medications: metformin 1000 mg bid, Amaryl 4 mg QD Current orders for Inpatient glycemic control: Lantus 12 units daily, Novolog 0-15 units Q4H  Inpatient Diabetes Program Recommendations:   Fasting CBG within range of 161. Noted Steroids starting to taper. Please consider adding Novolog 3 units tid meal coverage if eats 50%  Thank you, Bethena Roys E. Ayoub Arey, RN, MSN, CDE  Diabetes Coordinator Inpatient Glycemic Control Team Team Pager [phone] (8am-5pm) 12/31/2018 11:36 AM

## 2018-12-31 NOTE — Progress Notes (Signed)
Blood glucose was 413.MD notified. New orders placed for SSI and lantus to give now per MD.

## 2018-12-31 NOTE — Progress Notes (Signed)
Hematology Short Note  . CBC Latest Ref Rng & Units 12/31/2018 12/30/2018 12/29/2018  WBC 4.0 - 10.5 K/uL 23.0(H) 25.9(H) 16.5(H)  Hemoglobin 12.0 - 15.0 g/dL 6.2(LL) 6.5(LL) 3.2(LL)  Hematocrit 36.0 - 46.0 % 16.2(L) 18.1(L) 9.5(L)  Platelets 150 - 400 K/uL 250 348 521(H)   . CMP Latest Ref Rng & Units 12/31/2018 12/29/2018 12/29/2018  Glucose 70 - 99 mg/dL 233(H) - 418(H)  BUN 6 - 20 mg/dL 23(H) - 19  Creatinine 0.44 - 1.00 mg/dL 0.61 - 0.77  Sodium 135 - 145 mmol/L 138 - 129(L)  Potassium 3.5 - 5.1 mmol/L 3.3(L) - 4.1  Chloride 98 - 111 mmol/L 108 - 97(L)  CO2 22 - 32 mmol/L 19(L) - 18(L)  Calcium 8.9 - 10.3 mg/dL 8.7(L) - 8.3(L)  Total Protein 6.5 - 8.1 g/dL - - 6.8  Total Bilirubin 0.3 - 1.2 mg/dL - 6.9(H) 6.5(H)  Alkaline Phos 38 - 126 U/L - - 99  AST 15 - 41 U/L - - 31  ALT 0 - 44 U/L - - 58(H)    Labs reviewed. Plan as noted yesterday. Appreciate excellent care by hospitalist team.  Sullivan Lone MD

## 2019-01-01 LAB — BASIC METABOLIC PANEL
Anion gap: 7 (ref 5–15)
BUN: 22 mg/dL — ABNORMAL HIGH (ref 6–20)
CO2: 21 mmol/L — ABNORMAL LOW (ref 22–32)
Calcium: 8.3 mg/dL — ABNORMAL LOW (ref 8.9–10.3)
Chloride: 109 mmol/L (ref 98–111)
Creatinine, Ser: 0.62 mg/dL (ref 0.44–1.00)
GFR calc Af Amer: >60 mL/min (ref >60)
GFR calc non Af Amer: >60 mL/min (ref >60)
Glucose, Bld: 259 mg/dL — ABNORMAL HIGH (ref 70–99)
Potassium: 4.2 mmol/L (ref 3.5–5.1)
Sodium: 137 mmol/L (ref 135–145)

## 2019-01-01 LAB — CBC
HCT: 24.7 % — ABNORMAL LOW (ref 36.0–46.0)
Hemoglobin: 8.5 g/dL — ABNORMAL LOW (ref 12.0–15.0)
MCH: 34 pg (ref 26.0–34.0)
MCHC: 34.4 g/dL (ref 30.0–36.0)
MCV: 98.8 fL (ref 80.0–100.0)
Platelets: 151 10*3/uL (ref 150–400)
RBC: 2.5 MIL/uL — ABNORMAL LOW (ref 3.87–5.11)
RDW: 17.9 % — ABNORMAL HIGH (ref 11.5–15.5)
WBC: 18 10*3/uL — ABNORMAL HIGH (ref 4.0–10.5)
nRBC: 1.9 % — ABNORMAL HIGH (ref 0.0–0.2)

## 2019-01-01 LAB — RETICULOCYTES
Immature Retic Fract: 45.2 % — ABNORMAL HIGH (ref 2.3–15.9)
RBC.: 2.5 MIL/uL — ABNORMAL LOW (ref 3.87–5.11)
Retic Count, Absolute: 110.3 10*3/uL (ref 19.0–186.0)
Retic Ct Pct: 4.4 % — ABNORMAL HIGH (ref 0.4–3.1)

## 2019-01-01 LAB — LACTATE DEHYDROGENASE: LDH: 401 U/L — ABNORMAL HIGH (ref 98–192)

## 2019-01-01 LAB — C-REACTIVE PROTEIN: CRP: 8 mg/dL — ABNORMAL HIGH (ref <1.0)

## 2019-01-01 LAB — FERRITIN: Ferritin: 748 ng/mL — ABNORMAL HIGH (ref 11–307)

## 2019-01-01 LAB — GLUCOSE, CAPILLARY
Glucose-Capillary: 243 mg/dL — ABNORMAL HIGH (ref 70–99)
Glucose-Capillary: 225 mg/dL — ABNORMAL HIGH (ref 70–99)
Glucose-Capillary: 352 mg/dL — ABNORMAL HIGH (ref 70–99)
Glucose-Capillary: 347 mg/dL — ABNORMAL HIGH (ref 70–99)
Glucose-Capillary: 305 mg/dL — ABNORMAL HIGH (ref 70–99)

## 2019-01-01 LAB — D-DIMER, QUANTITATIVE: D-Dimer, Quant: 5.59 ug/mL-FEU — ABNORMAL HIGH (ref 0.00–0.50)

## 2019-01-01 MED ORDER — TRAMADOL HCL 50 MG PO TABS
50.0000 mg | ORAL_TABLET | Freq: Four times a day (QID) | ORAL | Status: DC | PRN
  Administered 2019-01-01: 50 mg via ORAL
  Filled 2019-01-01: qty 1

## 2019-01-01 MED ORDER — TRAZODONE HCL 50 MG PO TABS: 50.0000 mg | ORAL_TABLET | Freq: Every evening | ORAL | Status: DC | PRN

## 2019-01-01 MED ORDER — ENOXAPARIN SODIUM 40 MG/0.4ML ~~LOC~~ SOLN
40.0000 mg | SUBCUTANEOUS | Status: DC
  Administered 2019-01-01 – 2019-01-04 (×4): 40 mg via SUBCUTANEOUS
  Filled 2019-01-01 (×4): qty 0.4

## 2019-01-01 MED ORDER — PANTOPRAZOLE SODIUM 40 MG PO TBEC
40.0000 mg | DELAYED_RELEASE_TABLET | Freq: Every day | ORAL | Status: DC
  Administered 2019-01-01 – 2019-01-08 (×8): 40 mg via ORAL
  Filled 2019-01-01 (×8): qty 1

## 2019-01-01 MED ORDER — INSULIN GLARGINE 100 UNIT/ML ~~LOC~~ SOLN
40.0000 [IU] | Freq: Two times a day (BID) | SUBCUTANEOUS | Status: DC
  Administered 2019-01-01 – 2019-01-02 (×4): 40 [IU] via SUBCUTANEOUS
  Filled 2019-01-01 (×5): qty 0.4

## 2019-01-01 NOTE — Progress Notes (Signed)
Updated pt's daughter [place], via telephone. Denies questions.

## 2019-01-01 NOTE — Progress Notes (Signed)
[facility] PROGRESS NOTE    Progress Note  Hannah Dickson  JYN:[id number] DOB: 07/15/1965 DOA: 12/29/2018 PCP: Tula NakayamaKaram, Phillip Jerome, MD     Brief Narrative:   Hannah Dickson is an 53 y.o. female Spanish-speaking only with past medical history of autoimmune hemolytic anemia, diabetes mellitus and hypertension who presents to the ED after 3 to 4 days of abdominal pain nausea and vomiting, she relates no hematemesis, the daughter also reports jaundice which started 3 days prior to admission.  She also relate difficulty breathing coughing and diarrhea.  She was found to be tachycardic hypotensive with a hemoglobin of 3.2 a white count of 16.5 and a platelet count of 521 total bilirubin 6.5.  CT scan of the abdomen and pelvis was negative for any acute abnormalities, portable chest x-ray showed linear opacity in the left lung field.  She was transfused 2 units of packed red blood cells.  Assessment/Plan:   Acute hemolytic anemia/  Warm reactive antibody (HCC): Hemolysis likely triggered by COVID-19.  She was transfused 4 units of packed red blood cells she was pre- medicated before infusion. Her hemoglobin today is 8.5. Continue IV dexamethasone for 4 days, then transition to 6 mg daily, continue folic acid and B12 complex. Appreciate oncology's assistance. Reticulocyte count is 4.4 as well as immature cells are elevated. Leukocytosis slowly improving. LDH continues to be high in 401. Se has been off steroids since 09/2016.   She is not a candidate for rituximab in the setting of COVID-19 infection.  COVID-19 viral pneumonia: She continues to sat greater than 95% on room air.  When she is done with her dexamethasone IV for her hemolytic anemia will transition her to dexamethasone orally for a total of 10 days once of 4 days of dexamethasone 40 mg has completed. Ferritin is slightly improved, LDH and CRP are elevated she relates her breathing is unchanged. He is  satting greater than 95% on room air.  Type 2 diabetes mellitus, uncontrolled (HCC) Glucose overnight was over 400 her hemoglobin A1c is 6.4. Increase her long-acting insulin and change her sliding scale to resistant scale.  Acute metabolic encephalopathy: Acute medication induced due to Benadryl, now resolved.  Hyponatremia: Likely due to hypovolemia resolved with IV fluid hydration    Hypotension : Likely due to constant vomiting and decreased oral intake. Blood pressure is stable today.  Hyperbilirubinemia Likely due to hemolysis.  DVT prophylaxis: SCD's Family Communication:daughter Disposition Plan/Barrier to D/C: once hemolysis controlled. Code Status:     Code Status Orders  (From admission, onward)         Start     Ordered   12/30/18 0238  Full code  Continuous     12/30/18 0237        Code Status History    Date Active Date Inactive Code Status Order ID Comments User Context   01/08/2016 1819 01/10/2016 2340 Full Code [id number]  Pearson GrippeKim, James, MD Inpatient   02/17/2015 1714 02/20/2015 1555 Full Code [id number]  Jeralyn BennettZamora, Ezequiel, MD Inpatient   Advance Care Planning Activity        IV Access:    Peripheral IV   Procedures and diagnostic studies:   No results found.   Medical Consultants:    None.  Anti-Infectives:   none  Subjective:    [name] she relates her appetite is about the same no complaints today.  Objective:    Vitals:   12/31/18 1404 12/31/18 2018 12/31/18 2100 01/01/19 0405  BP: 134/80  108/70  Pulse: 93     Resp: (!) 33     Temp: 97.8 F (36.6 C) 98.5 F (36.9 C)  98.2 F (36.8 C)  TempSrc: Oral Oral  Oral  SpO2: 94%     Weight:      Height:       SpO2: 94 % O2 Flow Rate (L/min): 2 L/min   Intake/Output Summary (Last 24 hours) at 01/01/2019 0730 Last data filed at 01/01/2019 0630 Gross per 24 hour  Intake 1598 ml  Output -  Net 1598 ml   Filed Weights   12/29/18 1347  Weight: 68 kg     Exam: General exam: In no acute distress. Respiratory system: Good air movement and feels crackles bilaterally. Cardiovascular system: S1 & S2 heard, RRR. No JVD. Gastrointestinal system: Abdomen is nondistended, soft and nontender.  Central nervous system: Alert and oriented. No focal neurological deficits. Extremities: No pedal edema. Skin: No rashes, lesions or ulcers Psychiatry: Judgement and insight appear normal. Mood & affect appropriate.   Data Reviewed:    Labs: Basic Metabolic Panel: Recent Labs  Lab 12/29/18 1459 12/31/18 0200 12/31/18 2205  NA 129* 138  --   K 4.1 3.3*  --   CL 97* 108  --   CO2 18* 19*  --   GLUCOSE 418* 233* 438*  BUN 19 23*  --   CREATININE 0.77 0.61  --   CALCIUM 8.3* 8.7*  --    GFR Estimated Creatinine Clearance: 67.1 mL/min (by C-G formula based on SCr of 0.61 mg/dL). Liver Function Tests: Recent Labs  Lab 12/29/18 1459 12/29/18 1544  AST 31  --   ALT 58*  --   ALKPHOS 99  --   BILITOT 6.5* 6.9*  PROT 6.8  --   ALBUMIN 3.9  --    Recent Labs  Lab 12/29/18 1459  LIPASE 33   Recent Labs  Lab 12/30/18 1245  AMMONIA 41*   Coagulation profile No results for input(s): INR, PROTIME in the last 168 hours. COVID-19 Labs  Recent Labs    12/29/18 1544 12/31/18 0200  FERRITIN 1,139* 1,098*  LDH 371* 389*  CRP  --  3.0*    Lab Results  Component Value Date   SARSCOV2NAA POSITIVE (A) 12/29/2018    CBC: Recent Labs  Lab 12/29/18 1531 12/30/18 1820 12/31/18 0200 12/31/18 1617 01/01/19 0500  WBC 16.5* 25.9* 23.0*  --  18.0*  NEUTROABS 12.5* 20.7*  --   --   --   HGB 3.2* 6.5* 6.2* 8.8* 8.5*  HCT 9.5* 18.1* 16.2* 24.1* 24.7*  MCV 96.0 101.1* 100.6*  --  98.8  PLT 521* 348 250  --  151   Cardiac Enzymes: No results for input(s): CKTOTAL, CKMB, CKMBINDEX, TROPONINI in the last 168 hours. BNP (last 3 results) No results for input(s): PROBNP in the last 8760 hours. CBG: Recent Labs  Lab 12/31/18 0738 12/31/18  1142 12/31/18 1620 12/31/18 2024 01/01/19 0409  GLUCAP 161* 182* 413* 454* 243*   D-Dimer: No results for input(s): DDIMER in the last 72 hours. Hgb A1c: Recent Labs    12/29/18 1531  HGBA1C 6.4*   Lipid Profile: No results for input(s): CHOL, HDL, LDLCALC, TRIG, CHOLHDL, LDLDIRECT in the last 72 hours. Thyroid function studies: No results for input(s): TSH, T4TOTAL, T3FREE, THYROIDAB in the last 72 hours.  Invalid input(s): FREET3 Anemia work up: Recent Labs    12/29/18 1544 12/31/18 0200 01/01/19 0500  VITAMINB12 >7,500*  --   --  FOLATE 27.5  --   --   FERRITIN 1,139* 1,098*  --   TIBC 238*  --   --   IRON 197*  --   --   RETICCTPCT 4.3* 4.3* 4.4*   Sepsis Labs: Recent Labs  Lab 12/29/18 1531 12/30/18 1820 12/31/18 0200 01/01/19 0500  WBC 16.5* 25.9* 23.0* 18.0*   Microbiology Recent Results (from the past 240 hour(s))  SARS Coronavirus 2 (CEPHEID - [facility] Hosp Order     Status: Abnormal   Collection Time: 12/29/18  3:22 PM   Specimen: Nasopharyngeal Swab  Result Value Ref Range Status   SARS Coronavirus 2 POSITIVE (A) NEGATIVE Final    Comment: RESULT CALLED TO, READ BACK BY AND VERIFIED WITH: WESTON,L ON 12/29/18 AT 1855 BY LOY,C (NOTE) If result is NEGATIVE SARS-CoV-2 target nucleic acids are NOT DETECTED. The SARS-CoV-2 RNA is generally detectable in upper and lower  respiratory specimens during the acute phase of infection. The lowest  concentration of SARS-CoV-2 viral copies this assay can detect is 250  copies / mL. A negative result does not preclude SARS-CoV-2 infection  and should not be used as the sole basis for treatment or other  patient management decisions.  A negative result may occur with  improper specimen collection / handling, submission of specimen other  than nasopharyngeal swab, presence of viral mutation(s) within the  areas targeted by this assay, and inadequate number of viral copies  (<250  copies / mL). A negative result must be combined with clinical  observations, patient history, and epidemiological information. If result is POSITIVE SARS-CoV-2 target nucleic acids are DETECTED.  The SARS-CoV-2 RNA is generally detectable in upper and lower  respiratory specimens during the acute phase of infection.  Positive  results are indicative of active infection with SARS-CoV-2.  Clinical  correlation with patient history and other diagnostic information is  necessary to determine patient infection status.  Positive results do  not rule out bacterial infection or co-infection with other viruses. If result is PRESUMPTIVE POSTIVE SARS-CoV-2 nucleic acids MAY BE PRESENT.   A presumptive positive result was obtained on the submitted specimen  and confirmed on repeat testing.  While 2019 novel coronavirus  (SARS-CoV-2) nucleic acids may be present in the submitted sample  additional confirmatory testing may be necessary for epidemiological  and / or clinical management purposes  to differentiate between  SARS-CoV-2 and other Sarbecovirus currently known to infect humans.  If clinically indicated additional testing with an alternate test  methodology [phone]([id number]) i s advised. The SARS-CoV-2 RNA is generally  detectable in upper and lower respiratory specimens during the acute  phase of infection. The expected result is Negative. Fact Sheet for Patients:  BoilerBrush.com.cyhttps://www.fda.gov/media/136312/download Fact Sheet for Healthcare Providers: https://pope.com/https://www.fda.gov/media/136313/download This test is not yet approved or cleared by the [place] FDA and has been authorized for detection and/or diagnosis of SARS-CoV-2 by FDA under an Emergency Use Authorization (EUA).  This EUA will remain in effect (meaning this test can be used) for the duration of the COVID-19 declaration under Section 564(b)(1) of the Act, 21 U.S.C. section 360bbb-3(b)(1), unless the authorization is terminated or revoked  sooner. Performed at [facility], [address] [place], [place] [address]   MRSA PCR Screening     Status: None   Collection Time: 12/30/18  6:53 AM   Specimen: Nasal Mucosa; Nasopharyngeal  Result Value Ref Range Status   MRSA by PCR NEGATIVE NEGATIVE Final    Comment:  The GeneXpert MRSA Assay (FDA approved for NASAL specimens only), is one component of a comprehensive MRSA colonization surveillance program. It is not intended to diagnose MRSA infection nor to guide or monitor treatment for MRSA infections. Performed at [facility], [place] [address], [place], [place] [address]      Medications:   . sodium chloride   Intravenous Once  . atorvastatin  20 mg Oral Daily  . B-complex with vitamin C  1 tablet Oral Daily  . Chlorhexidine Gluconate Cloth  6 each Topical Daily  . dexamethasone (DECADRON) injection  40 mg Intravenous Q24H  . [START ON 01/02/2019] dexamethasone  6 mg Oral Q breakfast  . folic acid  5 mg Oral Daily  . insulin aspart  0-20 Units Subcutaneous TID WC  . insulin aspart  0-5 Units Subcutaneous QHS  . insulin aspart  6 Units Subcutaneous TID WC  . insulin glargine  30 Units Subcutaneous BID  . pantoprazole (PROTONIX) IV  40 mg Intravenous Q24H  . pneumococcal 23 valent vaccine  0.5 mL Intramuscular Tomorrow-1000   Continuous Infusions: . sodium chloride        LOS: 3 days   Charlynne Cousins  [facility]  01/01/2019, 7:30 AM

## 2019-01-02 LAB — BASIC METABOLIC PANEL
Anion gap: 9 (ref 5–15)
BUN: 20 mg/dL (ref 6–20)
CO2: 23 mmol/L (ref 22–32)
Calcium: 8.4 mg/dL — ABNORMAL LOW (ref 8.9–10.3)
Chloride: 104 mmol/L (ref 98–111)
Creatinine, Ser: 0.57 mg/dL (ref 0.44–1.00)
GFR calc Af Amer: >60 mL/min (ref >60)
GFR calc non Af Amer: >60 mL/min (ref >60)
Glucose, Bld: 260 mg/dL — ABNORMAL HIGH (ref 70–99)
Potassium: 5.1 mmol/L (ref 3.5–5.1)
Sodium: 136 mmol/L (ref 135–145)

## 2019-01-02 LAB — C-REACTIVE PROTEIN: CRP: 4 mg/dL — ABNORMAL HIGH (ref <1.0)

## 2019-01-02 LAB — CBC
HCT: 25.8 % — ABNORMAL LOW (ref 36.0–46.0)
Hemoglobin: 8.9 g/dL — ABNORMAL LOW (ref 12.0–15.0)
MCH: 34.2 pg — ABNORMAL HIGH (ref 26.0–34.0)
MCHC: 34.5 g/dL (ref 30.0–36.0)
MCV: 99.2 fL (ref 80.0–100.0)
Platelets: 112 10*3/uL — ABNORMAL LOW (ref 150–400)
RBC: 2.6 MIL/uL — ABNORMAL LOW (ref 3.87–5.11)
RDW: 17.6 % — ABNORMAL HIGH (ref 11.5–15.5)
WBC: 10.1 10*3/uL (ref 4.0–10.5)
nRBC: 0.6 % — ABNORMAL HIGH (ref 0.0–0.2)

## 2019-01-02 LAB — RETICULOCYTES
Immature Retic Fract: 31 % — ABNORMAL HIGH (ref 2.3–15.9)
RBC.: 2.6 MIL/uL — ABNORMAL LOW (ref 3.87–5.11)
Retic Count, Absolute: 133.4 10*3/uL (ref 19.0–186.0)
Retic Ct Pct: 5.1 % — ABNORMAL HIGH (ref 0.4–3.1)

## 2019-01-02 LAB — FERRITIN: Ferritin: 550 ng/mL — ABNORMAL HIGH (ref 11–307)

## 2019-01-02 LAB — GLUCOSE, CAPILLARY
Glucose-Capillary: 173 mg/dL — ABNORMAL HIGH (ref 70–99)
Glucose-Capillary: 198 mg/dL — ABNORMAL HIGH (ref 70–99)
Glucose-Capillary: 236 mg/dL — ABNORMAL HIGH (ref 70–99)
Glucose-Capillary: 287 mg/dL — ABNORMAL HIGH (ref 70–99)
Glucose-Capillary: 331 mg/dL — ABNORMAL HIGH (ref 70–99)
Glucose-Capillary: 393 mg/dL — ABNORMAL HIGH (ref 70–99)

## 2019-01-02 LAB — D-DIMER, QUANTITATIVE: D-Dimer, Quant: 2.59 ug/mL-FEU — ABNORMAL HIGH (ref 0.00–0.50)

## 2019-01-02 LAB — LACTATE DEHYDROGENASE: LDH: 400 U/L — ABNORMAL HIGH (ref 98–192)

## 2019-01-02 MED ORDER — SODIUM POLYSTYRENE SULFONATE 15 GM/60ML PO SUSP
15.0000 g | Freq: Once | ORAL | Status: AC
  Administered 2019-01-02: 15 g via ORAL
  Filled 2019-01-02: qty 60

## 2019-01-02 MED ORDER — INSULIN ASPART 100 UNIT/ML ~~LOC~~ SOLN
20.0000 [IU] | Freq: Once | SUBCUTANEOUS | Status: AC
  Administered 2019-01-02: 20 [IU] via SUBCUTANEOUS

## 2019-01-02 NOTE — Progress Notes (Signed)
Updated pt's daughter on plan care. No questions at this time.

## 2019-01-02 NOTE — Progress Notes (Signed)
This nurse spoke with patients son [name] and updated him on patient status and plan of care. All questions answered at this time

## 2019-01-02 NOTE — TOC Initial Note (Signed)
Transition of Care [facility]) - Initial/Assessment Note    Patient Details  Name: Hannah Dickson MRN: [id number] Date of Birth: Jun 22, 1965  [facility] CM/SW Contact:    Ninfa Meeker, RN Phone Number: [phone] (working remotely) 12/31/18, 11:26 AM  Clinical Narrative:    53 yr old female admitted with COVID 19. Case manager made follow up telephonic appointment with [facility] for Wednesday, January 20, 2019 at 10:30am with Dr. Asencion Noble. This was placed on AVS, CM will continue to follow for any discharge needs.         Patient Goals and CMS Choice        Expected Discharge Plan and Services     Discharge Planning Services: CM Consult, Follow-up appt scheduled, [facility]                                          Prior Living Arrangements/Services   Lives with:: Relatives                   Activities of Daily Living Home Assistive Devices/Equipment: None ADL Screening (condition at time of admission) Patient's cognitive ability adequate to safely complete daily activities?: Yes Is the patient deaf or have difficulty hearing?: No Does the patient have difficulty seeing, even when wearing glasses/contacts?: No Does the patient have difficulty concentrating, remembering, or making decisions?: No Patient able to express need for assistance with ADLs?: Yes Does the patient have difficulty dressing or bathing?: No Independently performs ADLs?: Yes (appropriate for developmental age) Does the patient have difficulty walking or climbing stairs?: No Weakness of Legs: None Weakness of Arms/Hands: None  Permission Sought/Granted                  Emotional Assessment              Admission diagnosis:  Autoimmune hemolytic anemia (HCC) [D59.1] Symptomatic anemia [D64.9] COVID-19 virus infection [U07.1] Acute hemolytic anemia ([place]) [D59.9] Patient Active Problem List   Diagnosis Date  Noted  . Acute metabolic encephalopathy 00/92/3300  . Acute hemolytic anemia ([place]) 12/29/2018  . Warm reactive antibody ([place]) 01/09/2016  . Hypotension 01/09/2016  . Hyponatremia 01/09/2016  . Hyperbilirubinemia 01/09/2016  . Thrombocytopenia ([place]) 01/09/2016  . Leukopenia 01/09/2016  . Symptomatic anemia 01/09/2016  . Acquired hemolytic anemia ([place]) 01/08/2016  . Autoimmune hemolytic anemia ([place]) 12/22/2015  . Optic neuritis   . Visual loss 02/17/2015  . Type 2 diabetes mellitus, uncontrolled ([place]) 02/17/2015   PCP:  [name], MD Pharmacy:   [place], [place] - [place] [address] [place] [address] [place] [address] Phone: [phone] Fax: [phone]     Social Determinants of Health (SDOH) Interventions    Readmission Risk Interventions No flowsheet data found.

## 2019-01-02 NOTE — Progress Notes (Signed)
[facility] PROGRESS NOTE    Progress Note  Hannah Dickson  PYK:[id number] DOB: 1966/06/16 DOA: 12/29/2018 PCP: [name], MD     Brief Narrative:   Hannah Dickson is an 53 y.o. female Spanish-speaking only with past medical history of autoimmune hemolytic anemia, diabetes mellitus and hypertension who presents to the ED after 3 to 4 days of abdominal pain nausea and vomiting, she relates no hematemesis, the daughter also reports jaundice which started 3 days prior to admission.  She also relate difficulty breathing coughing and diarrhea.  She was found to be tachycardic hypotensive with a hemoglobin of 3.2 a white count of 16.5 and a platelet count of 521 total bilirubin 6.5.  CT scan of the abdomen and pelvis was negative for any acute abnormalities, portable chest x-ray showed linear opacity in the left lung field.  She was transfused 2 units of packed red blood cells.  Assessment/Plan:   Acute hemolytic anemia/  Warm reactive antibody ([place]): Hemolysis likely triggered by COVID-19.  She was transfused 4 units of packed red blood cells, she was pre- medicated before infusion. Her hemoglobin today is 8.5. We will transition to oral dexamethasone today. Her reticulocyte count continues to increase. Leukocytosis has improved. LDH continues to be in the 400s. Se has been off steroids since 09/2016.   She is not a candidate for rituximab in the setting of COVID-19 infection.  COVID-19 viral pneumonia: She continues to saturate greater than 95% on room air.  Transition to oral dexamethasone. Ferritin is slightly improved, LDH and CRP are elevated, but improved compared to yesterday, she relates her breathing is unchanged. He is satting greater than 95% on room air.  Type 2 diabetes mellitus, uncontrolled (HCC) Her blood glucoses improve today, hemoglobin A1c is 6.4. We are decreasing her steroids today's show her blood glucose will continue to  improve.  Acute metabolic encephalopathy: Acute medication induced due to Benadryl, now resolved.  Hyponatremia: Likely due to hypovolemia resolved with IV fluid hydration. Is borderline hyperkalemia will recheck in the morning.    Hypotension : Likely due to constant vomiting and decreased oral intake. Blood pressure is stable today.  Hyperbilirubinemia Likely due to hemolysis.  DVT prophylaxis: SCD's Family Communication:daughter Disposition Plan/Barrier to D/C: once hemolysis controlled. Code Status:     Code Status Orders  (From admission, onward)         Start     Ordered   12/30/18 0238  Full code  Continuous     12/30/18 0237        Code Status History    Date Active Date Inactive Code Status Order ID Comments User Context   01/08/2016 1819 01/10/2016 2340 Full Code [id number]  Jani Gravel, MD Inpatient   02/17/2015 1714 02/20/2015 1555 Full Code [id number]  Kelvin Cellar, MD Inpatient   Advance Care Planning Activity        IV Access:    Peripheral IV   Procedures and diagnostic studies:   No results found.   Medical Consultants:    None.  Anti-Infectives:   none  Subjective:    Hannah Dickson she relates her breathing is unchanged compared to yesterday she continues to sat greater than 94% on room air.  Objective:    Vitals:   01/01/19 2000 01/01/19 2013 01/02/19 0006 01/02/19 0404  BP:  127/82 108/69 100/67  Pulse:  80    Resp:  (!) 21    Temp: 97.7 F (36.5 C) 98.1 F (36.7 C) 97.8 F (36.6  C) 98 F (36.7 C)  TempSrc: Oral Oral Oral Oral  SpO2:  100%    Weight:      Height:       SpO2: 100 % O2 Flow Rate (L/min): 2 L/min  No intake or output data in the 24 hours ending 01/02/19 0719 Filed Weights   12/29/18 1347  Weight: 68 kg    Exam: General exam: In no acute distress. Respiratory system: Good air movement and diffuse crackles bilaterally. Cardiovascular system: S1 & S2 heard, RRR. No JVD.  Gastrointestinal system: Abdomen is nondistended, soft and nontender.  Central nervous system: Alert and oriented. No focal neurological deficits. Extremities: No pedal edema. Skin: No rashes, lesions or ulcers Psychiatry: Judgement and insight appear normal. Mood & affect appropriate.    Data Reviewed:    Labs: Basic Metabolic Panel: Recent Labs  Lab 12/29/18 1459 12/31/18 0200 12/31/18 2205 01/01/19 0500 01/02/19 0400  NA 129* 138  --  137 136  K 4.1 3.3*  --  4.2 5.1  CL 97* 108  --  109 104  CO2 18* 19*  --  21* 23  GLUCOSE 418* 233* 438* 259* 260*  BUN 19 23*  --  22* 20  CREATININE 0.77 0.61  --  0.62 0.57  CALCIUM 8.3* 8.7*  --  8.3* 8.4*   GFR Estimated Creatinine Clearance: 67.1 mL/min (by C-G formula based on SCr of 0.57 mg/dL). Liver Function Tests: Recent Labs  Lab 12/29/18 1459 12/29/18 1544  AST 31  --   ALT 58*  --   ALKPHOS 99  --   BILITOT 6.5* 6.9*  PROT 6.8  --   ALBUMIN 3.9  --    Recent Labs  Lab 12/29/18 1459  LIPASE 33   Recent Labs  Lab 12/30/18 1245  AMMONIA 41*   Coagulation profile No results for input(s): INR, PROTIME in the last 168 hours. COVID-19 Labs  Recent Labs    12/31/18 0200 01/01/19 0500 01/02/19 0400  DDIMER  --  5.59* 2.59*  FERRITIN 1,098* 748* 550*  LDH 389* 401* 400*  CRP 3.0* 8.0* 4.0*    Lab Results  Component Value Date   SARSCOV2NAA POSITIVE (A) 12/29/2018    CBC: Recent Labs  Lab 12/29/18 1531 12/30/18 1820 12/31/18 0200 12/31/18 1617 01/01/19 0500 01/02/19 0400  WBC 16.5* 25.9* 23.0*  --  18.0* 10.1  NEUTROABS 12.5* 20.7*  --   --   --   --   HGB 3.2* 6.5* 6.2* 8.8* 8.5* 8.9*  HCT 9.5* 18.1* 16.2* 24.1* 24.7* 25.8*  MCV 96.0 101.1* 100.6*  --  98.8 99.2  PLT 521* 348 250  --  151 112*   Cardiac Enzymes: No results for input(s): CKTOTAL, CKMB, CKMBINDEX, TROPONINI in the last 168 hours. BNP (last 3 results) No results for input(s): PROBNP in the last 8760 hours. CBG: Recent  Labs  Lab 01/01/19 1108 01/01/19 1702 01/01/19 2112 01/02/19 0004 01/02/19 0408  GLUCAP 352* 347* 305* 287* 236*   D-Dimer: Recent Labs    01/01/19 0500 01/02/19 0400  DDIMER 5.59* 2.59*   Hgb A1c: No results for input(s): HGBA1C in the last 72 hours. Lipid Profile: No results for input(s): CHOL, HDL, LDLCALC, TRIG, CHOLHDL, LDLDIRECT in the last 72 hours. Thyroid function studies: No results for input(s): TSH, T4TOTAL, T3FREE, THYROIDAB in the last 72 hours.  Invalid input(s): FREET3 Anemia work up: Recent Labs    01/01/19 0500 01/02/19 0400  FERRITIN 748* 550*  RETICCTPCT 4.4* 5.1*  Sepsis Labs: Recent Labs  Lab 12/30/18 1820 12/31/18 0200 01/01/19 0500 01/02/19 0400  WBC 25.9* 23.0* 18.0* 10.1   Microbiology Recent Results (from the past 240 hour(s))  SARS Coronavirus 2 (CEPHEID - Performed in [facility] hospital lab), Hosp Order     Status: Abnormal   Collection Time: 12/29/18  3:22 PM   Specimen: Nasopharyngeal Swab  Result Value Ref Range Status   SARS Coronavirus 2 POSITIVE (A) NEGATIVE Final    Comment: RESULT CALLED TO, READ BACK BY AND VERIFIED WITH: WESTON,L ON 12/29/18 AT 1855 BY LOY,C (NOTE) If result is NEGATIVE SARS-CoV-2 target nucleic acids are NOT DETECTED. The SARS-CoV-2 RNA is generally detectable in upper and lower  respiratory specimens during the acute phase of infection. The lowest  concentration of SARS-CoV-2 viral copies this assay can detect is 250  copies / mL. A negative result does not preclude SARS-CoV-2 infection  and should not be used as the sole basis for treatment or other  patient management decisions.  A negative result may occur with  improper specimen collection / handling, submission of specimen other  than nasopharyngeal swab, presence of viral mutation(s) within the  areas targeted by this assay, and inadequate number of viral copies  (<250 copies / mL). A negative result must be combined with clinical   observations, patient history, and epidemiological information. If result is POSITIVE SARS-CoV-2 target nucleic acids are DETECTED.  The SARS-CoV-2 RNA is generally detectable in upper and lower  respiratory specimens during the acute phase of infection.  Positive  results are indicative of active infection with SARS-CoV-2.  Clinical  correlation with patient history and other diagnostic information is  necessary to determine patient infection status.  Positive results do  not rule out bacterial infection or co-infection with other viruses. If result is PRESUMPTIVE POSTIVE SARS-CoV-2 nucleic acids MAY BE PRESENT.   A presumptive positive result was obtained on the submitted specimen  and confirmed on repeat testing.  While 2019 novel coronavirus  (SARS-CoV-2) nucleic acids may be present in the submitted sample  additional confirmatory testing may be necessary for epidemiological  and / or clinical management purposes  to differentiate between  SARS-CoV-2 and other Sarbecovirus currently known to infect humans.  If clinically indicated additional testing with an alternate test  methodology [phone]([id number]) i s advised. The SARS-CoV-2 RNA is generally  detectable in upper and lower respiratory specimens during the acute  phase of infection. The expected result is Negative. Fact Sheet for Patients:  BoilerBrush.com.cyhttps://www.fda.gov/media/136312/download Fact Sheet for Healthcare Providers: https://pope.com/https://www.fda.gov/media/136313/download This test is not yet approved or cleared by the [place] FDA and has been authorized for detection and/or diagnosis of SARS-CoV-2 by FDA under an Emergency Use Authorization (EUA).  This EUA will remain in effect (meaning this test can be used) for the duration of the COVID-19 declaration under Section 564(b)(1) of the Act, 21 U.S.C. section 360bbb-3(b)(1), unless the authorization is terminated or revoked sooner. Performed at [facility], [address]  [place], [place] [address]   MRSA PCR Screening     Status: None   Collection Time: 12/30/18  6:53 AM   Specimen: Nasal Mucosa; Nasopharyngeal  Result Value Ref Range Status   MRSA by PCR NEGATIVE NEGATIVE Final    Comment:        The GeneXpert MRSA Assay (FDA approved for NASAL specimens only), is one component of a comprehensive MRSA colonization surveillance program. It is not intended to diagnose MRSA infection nor to guide or monitor  treatment for MRSA infections. Performed at [facility], [address] [place], [place] [address]      Medications:   . sodium chloride   Intravenous Once  . atorvastatin  20 mg Oral Daily  . B-complex with vitamin C  1 tablet Oral Daily  . Chlorhexidine Gluconate Cloth  6 each Topical Daily  . dexamethasone  6 mg Oral Q breakfast  . enoxaparin (LOVENOX) injection  40 mg Subcutaneous Q24H  . folic acid  5 mg Oral Daily  . insulin aspart  0-20 Units Subcutaneous TID WC  . insulin aspart  0-5 Units Subcutaneous QHS  . insulin aspart  6 Units Subcutaneous TID WC  . insulin glargine  40 Units Subcutaneous BID  . pantoprazole  40 mg Oral Daily  . pneumococcal 23 valent vaccine  0.5 mL Intramuscular Tomorrow-1000   Continuous Infusions: . sodium chloride        LOS: 4 days   Marinda ElkAbraham Feliz Ortiz  [facility]  01/02/2019, 7:19 AM

## 2019-01-03 ENCOUNTER — Inpatient Hospital Stay (HOSPITAL_COMMUNITY): Payer: HRSA Program

## 2019-01-03 LAB — GLUCOSE, CAPILLARY
Glucose-Capillary: 109 mg/dL — ABNORMAL HIGH (ref 70–99)
Glucose-Capillary: 314 mg/dL — ABNORMAL HIGH (ref 70–99)
Glucose-Capillary: 263 mg/dL — ABNORMAL HIGH (ref 70–99)
Glucose-Capillary: 344 mg/dL — ABNORMAL HIGH (ref 70–99)

## 2019-01-03 LAB — BASIC METABOLIC PANEL
Anion gap: 10 (ref 5–15)
BUN: 22 mg/dL — ABNORMAL HIGH (ref 6–20)
CO2: 22 mmol/L (ref 22–32)
Calcium: 8.1 mg/dL — ABNORMAL LOW (ref 8.9–10.3)
Chloride: 100 mmol/L (ref 98–111)
Creatinine, Ser: 0.86 mg/dL (ref 0.44–1.00)
GFR calc Af Amer: >60 mL/min (ref >60)
GFR calc non Af Amer: >60 mL/min (ref >60)
Glucose, Bld: 316 mg/dL — ABNORMAL HIGH (ref 70–99)
Potassium: 4.7 mmol/L (ref 3.5–5.1)
Sodium: 132 mmol/L — ABNORMAL LOW (ref 135–145)

## 2019-01-03 LAB — TYPE AND SCREEN
ABO/RH(D): O POS
Antibody Screen: POSITIVE
DAT, IgG: POSITIVE
Unit division: 0
Unit division: 0

## 2019-01-03 LAB — BPAM RBC
Blood Product Expiration Date: 202008132359
Blood Product Expiration Date: 202008212359
ISSUE DATE / TIME: 202007161004
ISSUE DATE / TIME: 202007161004
Unit Type and Rh: 5100
Unit Type and Rh: 9500

## 2019-01-03 LAB — LACTATE DEHYDROGENASE: LDH: 388 U/L — ABNORMAL HIGH (ref 98–192)

## 2019-01-03 LAB — RETICULOCYTES
Immature Retic Fract: 12 % (ref 2.3–15.9)
RBC.: 2.58 MIL/uL — ABNORMAL LOW (ref 3.87–5.11)
Retic Count, Absolute: 120.7 10*3/uL (ref 19.0–186.0)
Retic Ct Pct: 4.7 % — ABNORMAL HIGH (ref 0.4–3.1)

## 2019-01-03 LAB — FERRITIN: Ferritin: 584 ng/mL — ABNORMAL HIGH (ref 11–307)

## 2019-01-03 LAB — CBC
HCT: 26.4 % — ABNORMAL LOW (ref 36.0–46.0)
Hemoglobin: 9.2 g/dL — ABNORMAL LOW (ref 12.0–15.0)
MCH: 33.3 pg (ref 26.0–34.0)
MCHC: 34.8 g/dL (ref 30.0–36.0)
MCV: 95.7 fL (ref 80.0–100.0)
Platelets: 79 10*3/uL — ABNORMAL LOW (ref 150–400)
RBC: 2.76 MIL/uL — ABNORMAL LOW (ref 3.87–5.11)
RDW: 15.8 % — ABNORMAL HIGH (ref 11.5–15.5)
WBC: 9.6 10*3/uL (ref 4.0–10.5)
nRBC: 0 % (ref 0.0–0.2)

## 2019-01-03 LAB — C-REACTIVE PROTEIN: CRP: 1.8 mg/dL — ABNORMAL HIGH (ref <1.0)

## 2019-01-03 LAB — D-DIMER, QUANTITATIVE: D-Dimer, Quant: 2.47 ug/mL-FEU — ABNORMAL HIGH (ref 0.00–0.50)

## 2019-01-03 MED ORDER — DEXAMETHASONE 6 MG PO TABS
6.0000 mg | ORAL_TABLET | Freq: Every day | ORAL | Status: DC
  Administered 2019-01-03 – 2019-01-08 (×6): 6 mg via ORAL
  Filled 2019-01-03 (×6): qty 1

## 2019-01-03 MED ORDER — INSULIN ASPART 100 UNIT/ML ~~LOC~~ SOLN
4.0000 [IU] | Freq: Three times a day (TID) | SUBCUTANEOUS | Status: DC
  Administered 2019-01-03: 4 [IU] via SUBCUTANEOUS

## 2019-01-03 MED ORDER — INSULIN ASPART 100 UNIT/ML ~~LOC~~ SOLN
0.0000 [IU] | Freq: Three times a day (TID) | SUBCUTANEOUS | Status: DC
  Administered 2019-01-03: 11 [IU] via SUBCUTANEOUS

## 2019-01-03 MED ORDER — INSULIN ASPART 100 UNIT/ML ~~LOC~~ SOLN
6.0000 [IU] | Freq: Three times a day (TID) | SUBCUTANEOUS | Status: DC
  Administered 2019-01-03 – 2019-01-05 (×5): 6 [IU] via SUBCUTANEOUS

## 2019-01-03 MED ORDER — INSULIN GLARGINE 100 UNIT/ML ~~LOC~~ SOLN
20.0000 [IU] | Freq: Two times a day (BID) | SUBCUTANEOUS | Status: DC
  Administered 2019-01-03: 09:00:00 20 [IU] via SUBCUTANEOUS
  Filled 2019-01-03 (×2): qty 0.2

## 2019-01-03 MED ORDER — INSULIN GLARGINE 100 UNIT/ML ~~LOC~~ SOLN
20.0000 [IU] | Freq: Once | SUBCUTANEOUS | Status: AC
  Administered 2019-01-03: 20 [IU] via SUBCUTANEOUS
  Filled 2019-01-03: qty 0.2

## 2019-01-03 MED ORDER — INSULIN ASPART 100 UNIT/ML ~~LOC~~ SOLN
0.0000 [IU] | Freq: Three times a day (TID) | SUBCUTANEOUS | Status: DC
  Administered 2019-01-03: 11 [IU] via SUBCUTANEOUS
  Administered 2019-01-04: 08:00:00 3 [IU] via SUBCUTANEOUS
  Administered 2019-01-04 – 2019-01-05 (×3): 15 [IU] via SUBCUTANEOUS
  Administered 2019-01-05: 11 [IU] via SUBCUTANEOUS
  Administered 2019-01-06: 15 [IU] via SUBCUTANEOUS
  Administered 2019-01-06: 20 [IU] via SUBCUTANEOUS
  Administered 2019-01-07 (×2): 15 [IU] via SUBCUTANEOUS
  Administered 2019-01-08: 20 [IU] via SUBCUTANEOUS

## 2019-01-03 MED ORDER — INSULIN ASPART 100 UNIT/ML ~~LOC~~ SOLN: 0.0000 [IU] | Freq: Every day | SUBCUTANEOUS | Status: DC

## 2019-01-03 MED ORDER — INSULIN DETEMIR 100 UNIT/ML ~~LOC~~ SOLN: 20.0000 [IU] | Freq: Once | SUBCUTANEOUS | Status: DC

## 2019-01-03 MED ORDER — INSULIN GLARGINE 100 UNIT/ML ~~LOC~~ SOLN
40.0000 [IU] | Freq: Two times a day (BID) | SUBCUTANEOUS | Status: DC
  Administered 2019-01-03 – 2019-01-04 (×3): 40 [IU] via SUBCUTANEOUS
  Filled 2019-01-03 (×4): qty 0.4

## 2019-01-03 MED ORDER — INSULIN ASPART 100 UNIT/ML ~~LOC~~ SOLN
0.0000 [IU] | Freq: Every day | SUBCUTANEOUS | Status: DC
  Administered 2019-01-03: 4 [IU] via SUBCUTANEOUS
  Administered 2019-01-04: 21:00:00 3 [IU] via SUBCUTANEOUS
  Administered 2019-01-05 – 2019-01-06 (×2): 5 [IU] via SUBCUTANEOUS
  Administered 2019-01-07: 4 [IU] via SUBCUTANEOUS

## 2019-01-03 NOTE — Progress Notes (Signed)
[facility] PROGRESS NOTE    Progress Note  Hannah Dickson  ZOX:[id number] DOB: 08/11/1965 DOA: 12/29/2018 PCP: Tula NakayamaKaram, Phillip Jerome, MD     Brief Narrative:   Hannah Dickson is an 53 y.o. female Spanish-speaking only with past medical history of autoimmune hemolytic anemia, diabetes mellitus and hypertension who presents to the ED after 3 to 4 days of abdominal pain nausea and vomiting, she relates no hematemesis, the daughter also reports jaundice which started 3 days prior to admission.  She also relate difficulty breathing coughing and diarrhea.  She was found to be tachycardic hypotensive with a hemoglobin of 3.2 a white count of 16.5 and a platelet count of 521 total bilirubin 6.5.  CT scan of the abdomen and pelvis was negative for any acute abnormalities, portable chest x-ray showed linear opacity in the left lung field.  She was transfused 2 units of packed red blood cells.  Assessment/Plan:   Acute hemolytic anemia/  Warm reactive antibody [facility](HCC): Her hemolysis likely due to COVID-19, she is not a candidate for rituximab in the setting of SARS-CoV-2 infection. This post 4 units of packed red blood cells she was medicated before transfusion. Hemoglobin today is around 8.5. Her reticulocyte count is increasing.  Discontinue IV steroids. Se has been off steroids since 09/2016.    COVID-19 viral infection: She continues to saturate greater than 95% on room air.  Continue oral dexamethasone for 6 additional day. She has a new cough. Chest x-ray shows a new bilateral infiltrates more prominent on the right than on the left I have personally reviewed her chest x-ray Inflammatory markers are significantly improved.  Continue oral dexamethasone. She is satting greater than 95% on room air.  Type 2 diabetes mellitus, uncontrolled (HCC) Blood glucose continues to be erratic, her A1c was 6.4. Continue long-acting insulin plus sliding scale resistance.   Acute metabolic encephalopathy: Acute medication induced due to Benadryl, now resolved.  Hyponatremia: Likely due to hypovolemia resolved with IV fluid hydration. Is borderline hyperkalemia will recheck in the morning.    Hypotension : Likely due to constant vomiting and decreased oral intake. Blood pressure is stable today.  Hyperbilirubinemia Likely due to hemolysis.  DVT prophylaxis: SCD's Family Communication:daughter Disposition Plan/Barrier to D/C: once hemolysis controlled. Code Status:     Code Status Orders  (From admission, onward)         Start     Ordered   12/30/18 0238  Full code  Continuous     12/30/18 0237        Code Status History    Date Active Date Inactive Code Status Order ID Comments User Context   01/08/2016 1819 01/10/2016 2340 Full Code [id number]  Pearson GrippeKim, James, MD Inpatient   02/17/2015 1714 02/20/2015 1555 Full Code [id number]  Jeralyn BennettZamora, Ezequiel, MD Inpatient   Advance Care Planning Activity        IV Access:    Peripheral IV   Procedures and diagnostic studies:   No results found.   Medical Consultants:    None.  Anti-Infectives:   none  Subjective:    Hannah Dickson does relate a new cough, she denies any dyspnea.  Objective:    Vitals:   01/03/19 0327 01/03/19 0400 01/03/19 0500 01/03/19 0600  BP: 100/75     Pulse:  72 72 74  Resp:  (!) 22 (!) 21 (!) 22  Temp: 97.7 F (36.5 C)     TempSrc: Oral     SpO2:  98% 99% 98%  Weight:  Height:       SpO2: 98 % O2 Flow Rate (L/min): 2 L/min   Intake/Output Summary (Last 24 hours) at 01/03/2019 0812 Last data filed at 01/02/2019 0830 Gross per 24 hour  Intake 240 ml  Output -  Net 240 ml   Filed Weights   12/29/18 1347  Weight: 68 kg    Exam: General exam: In no acute distress. Respiratory system: Good air movement and crackles on the right lower lobe more prominent than on the left. Cardiovascular system: S1 & S2 heard, RRR. No JVD.  Gastrointestinal system: Abdomen is nondistended, soft and nontender.  Central nervous system: Alert and oriented. No focal neurological deficits. Extremities: No pedal edema. Skin: No rashes, lesions or ulcers Psychiatry: Judgement and insight appear normal. Mood & affect appropriate.    Data Reviewed:    Labs: Basic Metabolic Panel: Recent Labs  Lab 12/29/18 1459 12/31/18 0200 12/31/18 2205 01/01/19 0500 01/02/19 0400  NA 129* 138  --  137 136  K 4.1 3.3*  --  4.2 5.1  CL 97* 108  --  109 104  CO2 18* 19*  --  21* 23  GLUCOSE 418* 233* 438* 259* 260*  BUN 19 23*  --  22* 20  CREATININE 0.77 0.61  --  0.62 0.57  CALCIUM 8.3* 8.7*  --  8.3* 8.4*   GFR Estimated Creatinine Clearance: 67.1 mL/min (by C-G formula based on SCr of 0.57 mg/dL). Liver Function Tests: Recent Labs  Lab 12/29/18 1459 12/29/18 1544  AST 31  --   ALT 58*  --   ALKPHOS 99  --   BILITOT 6.5* 6.9*  PROT 6.8  --   ALBUMIN 3.9  --    Recent Labs  Lab 12/29/18 1459  LIPASE 33   Recent Labs  Lab 12/30/18 1245  AMMONIA 41*   Coagulation profile No results for input(s): INR, PROTIME in the last 168 hours. COVID-19 Labs  Recent Labs    01/01/19 0500 01/02/19 0400 01/03/19 0419  DDIMER 5.59* 2.59* 2.47*  FERRITIN 748* 550*  --   LDH 401* 400* 388*  CRP 8.0* 4.0*  --     Lab Results  Component Value Date   SARSCOV2NAA POSITIVE (A) 12/29/2018    CBC: Recent Labs  Lab 12/29/18 1531 12/30/18 1820 12/31/18 0200 12/31/18 1617 01/01/19 0500 01/02/19 0400  WBC 16.5* 25.9* 23.0*  --  18.0* 10.1  NEUTROABS 12.5* 20.7*  --   --   --   --   HGB 3.2* 6.5* 6.2* 8.8* 8.5* 8.9*  HCT 9.5* 18.1* 16.2* 24.1* 24.7* 25.8*  MCV 96.0 101.1* 100.6*  --  98.8 99.2  PLT 521* 348 250  --  151 112*   Cardiac Enzymes: No results for input(s): CKTOTAL, CKMB, CKMBINDEX, TROPONINI in the last 168 hours. BNP (last 3 results) No results for input(s): PROBNP in the last 8760 hours. CBG: Recent  Labs  Lab 01/02/19 0408 01/02/19 0753 01/02/19 1125 01/02/19 1641 01/02/19 2222  GLUCAP 236* 198* 331* 393* 173*   D-Dimer: Recent Labs    01/02/19 0400 01/03/19 0419  DDIMER 2.59* 2.47*   Hgb A1c: No results for input(s): HGBA1C in the last 72 hours. Lipid Profile: No results for input(s): CHOL, HDL, LDLCALC, TRIG, CHOLHDL, LDLDIRECT in the last 72 hours. Thyroid function studies: No results for input(s): TSH, T4TOTAL, T3FREE, THYROIDAB in the last 72 hours.  Invalid input(s): FREET3 Anemia work up: Recent Labs    01/01/19 0500 01/02/19 0400 01/03/19  0419  FERRITIN 748* 550*  --   RETICCTPCT 4.4* 5.1* 4.7*   Sepsis Labs: Recent Labs  Lab 12/30/18 1820 12/31/18 0200 01/01/19 0500 01/02/19 0400  WBC 25.9* 23.0* 18.0* 10.1   Microbiology Recent Results (from the past 240 hour(s))  SARS Coronavirus 2 (CEPHEID - Performed in [facility] hospital lab), Hosp Order     Status: Abnormal   Collection Time: 12/29/18  3:22 PM   Specimen: Nasopharyngeal Swab  Result Value Ref Range Status   SARS Coronavirus 2 POSITIVE (A) NEGATIVE Final    Comment: RESULT CALLED TO, READ BACK BY AND VERIFIED WITH: WESTON,L ON 12/29/18 AT 1855 BY LOY,C (NOTE) If result is NEGATIVE SARS-CoV-2 target nucleic acids are NOT DETECTED. The SARS-CoV-2 RNA is generally detectable in upper and lower  respiratory specimens during the acute phase of infection. The lowest  concentration of SARS-CoV-2 viral copies this assay can detect is 250  copies / mL. A negative result does not preclude SARS-CoV-2 infection  and should not be used as the sole basis for treatment or other  patient management decisions.  A negative result may occur with  improper specimen collection / handling, submission of specimen other  than nasopharyngeal swab, presence of viral mutation(s) within the  areas targeted by this assay, and inadequate number of viral copies  (<250 copies / mL). A negative result must be  combined with clinical  observations, patient history, and epidemiological information. If result is POSITIVE SARS-CoV-2 target nucleic acids are DETECTED.  The SARS-CoV-2 RNA is generally detectable in upper and lower  respiratory specimens during the acute phase of infection.  Positive  results are indicative of active infection with SARS-CoV-2.  Clinical  correlation with patient history and other diagnostic information is  necessary to determine patient infection status.  Positive results do  not rule out bacterial infection or co-infection with other viruses. If result is PRESUMPTIVE POSTIVE SARS-CoV-2 nucleic acids MAY BE PRESENT.   A presumptive positive result was obtained on the submitted specimen  and confirmed on repeat testing.  While 2019 novel coronavirus  (SARS-CoV-2) nucleic acids may be present in the submitted sample  additional confirmatory testing may be necessary for epidemiological  and / or clinical management purposes  to differentiate between  SARS-CoV-2 and other Sarbecovirus currently known to infect humans.  If clinically indicated additional testing with an alternate test  methodology [phone]([id number]) i s advised. The SARS-CoV-2 RNA is generally  detectable in upper and lower respiratory specimens during the acute  phase of infection. The expected result is Negative. Fact Sheet for Patients:  BoilerBrush.com.cyhttps://www.fda.gov/media/136312/download Fact Sheet for Healthcare Providers: https://pope.com/https://www.fda.gov/media/136313/download This test is not yet approved or cleared by the [place] FDA and has been authorized for detection and/or diagnosis of SARS-CoV-2 by FDA under an Emergency Use Authorization (EUA).  This EUA will remain in effect (meaning this test can be used) for the duration of the COVID-19 declaration under Section 564(b)(1) of the Act, 21 U.S.C. section 360bbb-3(b)(1), unless the authorization is terminated or revoked sooner. Performed at [facility],  [address] [place], [place] [address]   MRSA PCR Screening     Status: None   Collection Time: 12/30/18  6:53 AM   Specimen: Nasal Mucosa; Nasopharyngeal  Result Value Ref Range Status   MRSA by PCR NEGATIVE NEGATIVE Final    Comment:        The GeneXpert MRSA Assay (FDA approved for NASAL specimens only), is one component of a comprehensive MRSA colonization  surveillance program. It is not intended to diagnose MRSA infection nor to guide or monitor treatment for MRSA infections. Performed at [facility], [place] [address], [place], [place] [address]      Medications:   . sodium chloride   Intravenous Once  . atorvastatin  20 mg Oral Daily  . B-complex with vitamin C  1 tablet Oral Daily  . Chlorhexidine Gluconate Cloth  6 each Topical Daily  . dexamethasone  6 mg Oral Q breakfast  . enoxaparin (LOVENOX) injection  40 mg Subcutaneous Q24H  . folic acid  5 mg Oral Daily  . insulin aspart  0-20 Units Subcutaneous TID WC  . insulin aspart  0-5 Units Subcutaneous QHS  . insulin aspart  6 Units Subcutaneous TID WC  . insulin glargine  40 Units Subcutaneous BID  . pantoprazole  40 mg Oral Daily  . pneumococcal 23 valent vaccine  0.5 mL Intramuscular Tomorrow-1000   Continuous Infusions: . sodium chloride        LOS: 5 days   Charlynne Cousins  [facility]  01/03/2019, 8:12 AM

## 2019-01-04 LAB — BASIC METABOLIC PANEL
Anion gap: 9 (ref 5–15)
BUN: 22 mg/dL — ABNORMAL HIGH (ref 6–20)
CO2: 22 mmol/L (ref 22–32)
Calcium: 8.3 mg/dL — ABNORMAL LOW (ref 8.9–10.3)
Chloride: 103 mmol/L (ref 98–111)
Creatinine, Ser: 0.58 mg/dL (ref 0.44–1.00)
GFR calc Af Amer: >60 mL/min (ref >60)
GFR calc non Af Amer: >60 mL/min (ref >60)
Glucose, Bld: 228 mg/dL — ABNORMAL HIGH (ref 70–99)
Potassium: 4.2 mmol/L (ref 3.5–5.1)
Sodium: 134 mmol/L — ABNORMAL LOW (ref 135–145)

## 2019-01-04 LAB — RETICULOCYTES
Immature Retic Fract: 1.7 % — ABNORMAL LOW (ref 2.3–15.9)
RBC.: 2.43 MIL/uL — ABNORMAL LOW (ref 3.87–5.11)
Retic Count, Absolute: 67.8 10*3/uL (ref 19.0–186.0)
Retic Ct Pct: 2.8 % (ref 0.4–3.1)

## 2019-01-04 LAB — CBC
HCT: 23.4 % — ABNORMAL LOW (ref 36.0–46.0)
Hemoglobin: 8.2 g/dL — ABNORMAL LOW (ref 12.0–15.0)
MCH: 33.7 pg (ref 26.0–34.0)
MCHC: 35 g/dL (ref 30.0–36.0)
MCV: 96.3 fL (ref 80.0–100.0)
Platelets: 68 10*3/uL — ABNORMAL LOW (ref 150–400)
RBC: 2.43 MIL/uL — ABNORMAL LOW (ref 3.87–5.11)
RDW: 14.8 % (ref 11.5–15.5)
WBC: 8.8 10*3/uL (ref 4.0–10.5)
nRBC: 0 % (ref 0.0–0.2)

## 2019-01-04 LAB — IMMATURE PLATELET FRACTION: Immature Platelet Fraction: 18.7 % — ABNORMAL HIGH (ref 1.2–8.6)

## 2019-01-04 LAB — GLUCOSE, CAPILLARY
Glucose-Capillary: 147 mg/dL — ABNORMAL HIGH (ref 70–99)
Glucose-Capillary: 341 mg/dL — ABNORMAL HIGH (ref 70–99)
Glucose-Capillary: 332 mg/dL — ABNORMAL HIGH (ref 70–99)
Glucose-Capillary: 270 mg/dL — ABNORMAL HIGH (ref 70–99)

## 2019-01-04 LAB — FERRITIN: Ferritin: 590 ng/mL — ABNORMAL HIGH (ref 11–307)

## 2019-01-04 LAB — D-DIMER, QUANTITATIVE: D-Dimer, Quant: 1.37 ug/mL-FEU — ABNORMAL HIGH (ref 0.00–0.50)

## 2019-01-04 LAB — LACTATE DEHYDROGENASE: LDH: 331 U/L — ABNORMAL HIGH (ref 98–192)

## 2019-01-04 LAB — C-REACTIVE PROTEIN: CRP: 6.7 mg/dL — ABNORMAL HIGH (ref <1.0)

## 2019-01-04 MED ORDER — SODIUM CHLORIDE 0.9 % IV SOLN: 250.0000 mL | INTRAVENOUS | Status: DC | PRN

## 2019-01-04 MED ORDER — FONDAPARINUX SODIUM 2.5 MG/0.5ML ~~LOC~~ SOLN
2.5000 mg | Freq: Every day | SUBCUTANEOUS | Status: DC
  Administered 2019-01-05 – 2019-01-08 (×4): 2.5 mg via SUBCUTANEOUS
  Filled 2019-01-04 (×5): qty 0.5

## 2019-01-04 MED ORDER — ENOXAPARIN SODIUM 40 MG/0.4ML ~~LOC~~ SOLN: 40.0000 mg | SUBCUTANEOUS | Status: DC

## 2019-01-04 MED ORDER — SODIUM CHLORIDE 0.9% FLUSH: 3.0000 mL | INTRAVENOUS | Status: DC | PRN

## 2019-01-04 MED ORDER — SODIUM CHLORIDE 0.9% FLUSH
3.0000 mL | Freq: Two times a day (BID) | INTRAVENOUS | Status: DC
  Administered 2019-01-04 – 2019-01-07 (×8): 3 mL via INTRAVENOUS

## 2019-01-04 NOTE — Progress Notes (Signed)
ANTICOAGULATION CONSULT NOTE - Initial Consult  Pharmacy Consult for Arixtra Indication: VTE prophylaxis  No Known Allergies  Patient Measurements: Height: 4\' 10"  (147.3 cm) Weight: 150 lb (68 kg) IBW/kg (Calculated) : 40.9  Vital Signs: Temp: 98.3 F (36.8 C) (07/20 0745) Temp Source: Oral (07/20 0442) BP: 104/61 (07/20 0745) Pulse Rate: 68 (07/20 0700)  Labs: Recent Labs    01/02/19 0400 01/03/19 1600 01/04/19 0420  HGB 8.9* 9.2* 8.2*  HCT 25.8* 26.4* 23.4*  PLT 112* 79* 68*  CREATININE 0.57 0.86 0.58    Estimated Creatinine Clearance: 67.1 mL/min (by C-G formula based on SCr of 0.58 mg/dL).   Medical History: Past Medical History:  Diagnosis Date  . Acquired hemolytic anemia ([place]) 01/08/2016  . Allergy   . Anemia   . Arthritis   . Diabetes mellitus without complication ([place])   . Hypertension   . Shortness of breath dyspnea    slight according to patient due to anemia  . Visual changes     Medications:  Scheduled:  . sodium chloride   Intravenous Once  . atorvastatin  20 mg Oral Daily  . B-complex with vitamin C  1 tablet Oral Daily  . Chlorhexidine Gluconate Cloth  6 each Topical Daily  . dexamethasone  6 mg Oral Q breakfast  . folic acid  5 mg Oral Daily  . insulin aspart  0-20 Units Subcutaneous TID WC  . insulin aspart  0-5 Units Subcutaneous QHS  . insulin aspart  6 Units Subcutaneous TID WC  . insulin glargine  40 Units Subcutaneous BID  . pantoprazole  40 mg Oral Daily  . pneumococcal 23 valent vaccine  0.5 mL Intramuscular Tomorrow-1000  . sodium chloride flush  3 mL Intravenous Q12H    Assessment: 53 y.o. admitted with COVID. Noted pt with hemolytic anemia. Pt with new thrombocytopenia. Plt 521 on admission (7/14), now down to 68. Pt receiving Lovenox 40mg  daily 7/17-7/20. Lovenox now being held and HIT Ab ordered.   "Dr. Irene Limbo (hem/onc) recommends to stop heparin, he is concerned about virus producing the thrombocytopenia, check for HIT,  for immature platelet function and continue to monitor platelet count closely if they fall below 35,000 he recommended to start IVIG. Also start fondaparinux for DVT prophylaxis as per his request"  4T score = 2 (correlates with low risk for HIT)  Goal of Therapy:  VTE prophylaxis Monitor platelets by anticoagulation protocol: Yes   Plan:  Arixtra 2.5mg  SQ daily Will f/u HIT Ab and plt  Sherlon Handing, PharmD, BCPS Clinical pharmacist 01/04/2019,10:16 AM

## 2019-01-04 NOTE — Progress Notes (Signed)
Pt daughter updated on POC via phone. No questions at this time.

## 2019-01-04 NOTE — Progress Notes (Signed)
[facility] PROGRESS NOTE    Progress Note  Hannah Dickson  ZOX:[id number] DOB: 11/14/1965 DOA: 12/29/2018 PCP: Tula NakayamaKaram, Phillip Jerome, MD     Brief Narrative:   Hannah Dickson is an 53 y.o. female Spanish-speaking only with past medical history of autoimmune hemolytic anemia, diabetes mellitus and hypertension who presents to the ED after 3 to 4 days of abdominal pain nausea and vomiting, she relates no hematemesis, the daughter also reports jaundice which started 3 days prior to admission.  She also relate difficulty breathing coughing and diarrhea.  She was found to be tachycardic hypotensive with a hemoglobin of 3.2 a white count of 16.5 and a platelet count of 521 total bilirubin 6.5.  CT scan of the abdomen and pelvis was negative for any acute abnormalities, portable chest x-ray showed linear opacity in the left lung field.  She was transfused 2 units of packed red blood cells.  Assessment/Plan:   Acute hemolytic anemia/  Warm reactive antibody [facility]HCC): Her hemolysis likely due to COVID-19, she is not a candidate for rituximab in the setting of SARS-CoV-2 infection. This post 4 units of packed red blood cells she was medicated before transfusion. Hemoglobin today is around 8.5. Her reticulocyte count is increasing.  Discontinue IV steroids. Se has been off steroids since 09/2016.    COVID-19 viral infection: She continues to saturate greater than 95% on room air.  Continue oral dexamethasone for 6 additional day. She has a new cough. Chest x-ray shows a new bilateral infiltrates more prominent on the right than on the left I have personally reviewed her chest x-ray Inflammatory markers are significantly improved.  Continue oral dexamethasone. She is satting greater than 95% on room air.  Type 2 diabetes mellitus, uncontrolled (HCC) Blood glucose continues to be erratic, her A1c was 6.4. Continue long-acting insulin plus sliding scale resistance.   Acute metabolic encephalopathy: Acute medication induced due to Benadryl, now resolved.  Hyponatremia: Likely due to hypovolemia resolved with IV fluid hydration. Is borderline hyperkalemia will recheck in the morning.    Hypotension : Likely due to constant vomiting and decreased oral intake. Blood pressure is stable today.  Hyperbilirubinemia Likely due to hemolysis.  New Thrombocytopenia: Discussed the case with Dr. Candise CheKale, who recommended to stop heparin, he is concerned about virus producing the thrombocytopenia, check for HIT, for immature platelet function and continue to monitor platelet count closely if they fall below 35,000 he recommended to start IVIG. Also start fondaparinux for DVT prophylaxis as per his request   DVT prophylaxis: SCD's Family Communication:daughter Disposition Plan/Barrier to D/C: once hemolysis controlled. Code Status:     Code Status Orders  (From admission, onward)         Start     Ordered   12/30/18 0238  Full code  Continuous     12/30/18 0237        Code Status History    Date Active Date Inactive Code Status Order ID Comments User Context   01/08/2016 1819 01/10/2016 2340 Full Code [id number]  Pearson GrippeKim, James, MD Inpatient   02/17/2015 1714 02/20/2015 1555 Full Code [id number]  Jeralyn BennettZamora, Ezequiel, MD Inpatient   Advance Care Planning Activity        IV Access:    Peripheral IV   Procedures and diagnostic studies:   Dg Chest Port 1 View  Result Date: 01/03/2019 CLINICAL DATA:  Cough EXAM: PORTABLE CHEST 1 VIEW COMPARISON:  12/29/2018 FINDINGS: Heart size is normal. Mediastinal shadows are normal. There are patchy infiltrates in  both lower lungs, left more than right, suggesting pneumonia. This could be viral or bacterial. Upper lungs are clear. No visible effusion. No bone abnormality. IMPRESSION: Patchy infiltrates in both lower lobes left more than right that could be viral pneumonia. Electronically Signed   By: Nelson Chimes M.D.   On:  01/03/2019 11:33     Medical Consultants:    None.  Anti-Infectives:   none  Subjective:    Hannah Dickson she has no new complaints.  Objective:    Vitals:   01/04/19 0200 01/04/19 0300 01/04/19 0400 01/04/19 0442  BP:    106/67  Pulse: 72 69 72   Resp: (!) 21 (!) 22 (!) 22   Temp:    97.8 F (36.6 C)  TempSrc:    Oral  SpO2: 99% 100% 98%   Weight:      Height:       SpO2: 98 % O2 Flow Rate (L/min): 2 L/min  No intake or output data in the 24 hours ending 01/04/19 0657 Filed Weights   12/29/18 1347  Weight: 68 kg    Exam: General exam: In no acute distress. Respiratory system: Good air movement and crackles on the right lung Cardiovascular system: S1 & S2 heard, RRR. No JVD,. Gastrointestinal system: Abdomen is nondistended, soft and nontender.  Central nervous system: Alert and oriented. No focal neurological deficits. Extremities: No pedal edema. Skin: No rashes, lesions or ulcers Psychiatry: Judgement and insight appear normal. Mood & affect appropriate.    Data Reviewed:    Labs: Basic Metabolic Panel: Recent Labs  Lab 12/29/18 1459 12/31/18 0200 12/31/18 2205 01/01/19 0500 01/02/19 0400 01/03/19 1600  NA 129* 138  --  137 136 132*  K 4.1 3.3*  --  4.2 5.1 4.7  CL 97* 108  --  109 104 100  CO2 18* 19*  --  21* 23 22  GLUCOSE 418* 233* 438* 259* 260* 316*  BUN 19 23*  --  22* 20 22*  CREATININE 0.77 0.61  --  0.62 0.57 0.86  CALCIUM 8.3* 8.7*  --  8.3* 8.4* 8.1*   GFR Estimated Creatinine Clearance: 62.5 mL/min (by C-G formula based on SCr of 0.86 mg/dL). Liver Function Tests: Recent Labs  Lab 12/29/18 1459 12/29/18 1544  AST 31  --   ALT 58*  --   ALKPHOS 99  --   BILITOT 6.5* 6.9*  PROT 6.8  --   ALBUMIN 3.9  --    Recent Labs  Lab 12/29/18 1459  LIPASE 33   Recent Labs  Lab 12/30/18 1245  AMMONIA 41*   Coagulation profile No results for input(s): INR, PROTIME in the last 168 hours. COVID-19 Labs   Recent Labs    01/02/19 0400 01/03/19 0419  DDIMER 2.59* 2.47*  FERRITIN 550* 584*  LDH 400* 388*  CRP 4.0* 1.8*    Lab Results  Component Value Date   SARSCOV2NAA POSITIVE (A) 12/29/2018    CBC: Recent Labs  Lab 12/29/18 1531 12/30/18 1820 12/31/18 0200 12/31/18 1617 01/01/19 0500 01/02/19 0400 01/03/19 1600 01/04/19 0420  WBC 16.5* 25.9* 23.0*  --  18.0* 10.1 9.6 8.8  NEUTROABS 12.5* 20.7*  --   --   --   --   --   --   HGB 3.2* 6.5* 6.2* 8.8* 8.5* 8.9* 9.2* 8.2*  HCT 9.5* 18.1* 16.2* 24.1* 24.7* 25.8* 26.4* 23.4*  MCV 96.0 101.1* 100.6*  --  98.8 99.2 95.7 96.3  PLT 521* 348 250  --  151 112* 79* 68*   Cardiac Enzymes: No results for input(s): CKTOTAL, CKMB, CKMBINDEX, TROPONINI in the last 168 hours. BNP (last 3 results) No results for input(s): PROBNP in the last 8760 hours. CBG: Recent Labs  Lab 01/02/19 2222 01/03/19 0837 01/03/19 1130 01/03/19 1622 01/03/19 2107  GLUCAP 173* 109* 314* 263* 344*   D-Dimer: Recent Labs    01/02/19 0400 01/03/19 0419  DDIMER 2.59* 2.47*   Hgb A1c: No results for input(s): HGBA1C in the last 72 hours. Lipid Profile: No results for input(s): CHOL, HDL, LDLCALC, TRIG, CHOLHDL, LDLDIRECT in the last 72 hours. Thyroid function studies: No results for input(s): TSH, T4TOTAL, T3FREE, THYROIDAB in the last 72 hours.  Invalid input(s): FREET3 Anemia work up: Recent Labs    01/02/19 0400 01/03/19 0419 01/04/19 0420  FERRITIN 550* 584*  --   RETICCTPCT 5.1* 4.7* 2.8   Sepsis Labs: Recent Labs  Lab 01/01/19 0500 01/02/19 0400 01/03/19 1600 01/04/19 0420  WBC 18.0* 10.1 9.6 8.8   Microbiology Recent Results (from the past 240 hour(s))  SARS Coronavirus 2 (CEPHEID - Performed in [facility] lab), Hosp Order     Status: Abnormal   Collection Time: 12/29/18  3:22 PM   Specimen: Nasopharyngeal Swab  Result Value Ref Range Status   SARS Coronavirus 2 POSITIVE (A) NEGATIVE Final    Comment: RESULT  CALLED TO, READ BACK BY AND VERIFIED WITH: WESTON,L ON 12/29/18 AT 1855 BY LOY,C (NOTE) If result is NEGATIVE SARS-CoV-2 target nucleic acids are NOT DETECTED. The SARS-CoV-2 RNA is generally detectable in upper and lower  respiratory specimens during the acute phase of infection. The lowest  concentration of SARS-CoV-2 viral copies this assay can detect is 250  copies / mL. A negative result does not preclude SARS-CoV-2 infection  and should not be used as the sole basis for treatment or other  patient management decisions.  A negative result may occur with  improper specimen collection / handling, submission of specimen other  than nasopharyngeal swab, presence of viral mutation(s) within the  areas targeted by this assay, and inadequate number of viral copies  (<250 copies / mL). A negative result must be combined with clinical  observations, patient history, and epidemiological information. If result is POSITIVE SARS-CoV-2 target nucleic acids are DETECTED.  The SARS-CoV-2 RNA is generally detectable in upper and lower  respiratory specimens during the acute phase of infection.  Positive  results are indicative of active infection with SARS-CoV-2.  Clinical  correlation with patient history and other diagnostic information is  necessary to determine patient infection status.  Positive results do  not rule out bacterial infection or co-infection with other viruses. If result is PRESUMPTIVE POSTIVE SARS-CoV-2 nucleic acids MAY BE PRESENT.   A presumptive positive result was obtained on the submitted specimen  and confirmed on repeat testing.  While 2019 novel coronavirus  (SARS-CoV-2) nucleic acids may be present in the submitted sample  additional confirmatory testing may be necessary for epidemiological  and / or clinical management purposes  to differentiate between  SARS-CoV-2 and other Sarbecovirus currently known to infect humans.  If clinically indicated additional testing  with an alternate test  methodology [phone] i s advised. The SARS-CoV-2 RNA is generally  detectable in upper and lower respiratory specimens during the acute  phase of infection. The expected result is Negative. Fact Sheet for Patients:  BoilerBrush.com.cyhttps://www.fda.gov/media/136312/download Fact Sheet for Healthcare Providers: https://pope.com/https://www.fda.gov/media/136313/download This test is not yet approved or cleared by the [place] FDA and has been authorized for detection and/or diagnosis of SARS-CoV-2 by FDA under an Emergency Use Authorization (EUA).  This EUA will remain in effect (meaning this test can be used) for the duration of the COVID-19 declaration under Section 564(b)(1) of the Act, 21 U.S.C. section 360bbb-3(b)(1), unless the authorization is terminated or revoked sooner. Performed at [facility], [address] [place], [place] [address]   MRSA PCR Screening     Status: None   Collection Time: 12/30/18  6:53 AM   Specimen: Nasal Mucosa; Nasopharyngeal  Result Value Ref Range Status   MRSA by PCR NEGATIVE NEGATIVE Final    Comment:        The GeneXpert MRSA Assay (FDA approved for NASAL specimens only), is one component of a comprehensive MRSA colonization surveillance program. It is not intended to diagnose MRSA infection nor to guide or monitor treatment for MRSA infections. Performed at [facility], [address] [place], [place] [address]      Medications:   . sodium chloride   Intravenous Once  . atorvastatin  20 mg Oral Daily  . B-complex with vitamin C  1 tablet Oral Daily  . Chlorhexidine Gluconate Cloth  6 each Topical Daily  . dexamethasone  6 mg Oral Q breakfast  . enoxaparin (LOVENOX) injection  40 mg Subcutaneous Q24H  . folic acid  5 mg Oral Daily  . insulin aspart  0-20 Units Subcutaneous TID WC  . insulin aspart  0-5 Units Subcutaneous QHS  . insulin aspart  6 Units Subcutaneous TID WC  . insulin glargine  40 Units  Subcutaneous BID  . pantoprazole  40 mg Oral Daily  . pneumococcal 23 valent vaccine  0.5 mL Intramuscular Tomorrow-1000   Continuous Infusions: . sodium chloride        LOS: 6 days   Marinda ElkAbraham Feliz Ortiz  [facility]  01/04/2019, 6:57 AM

## 2019-01-05 LAB — CBC
HCT: 20.9 % — ABNORMAL LOW (ref 36.0–46.0)
HCT: 26.8 % — ABNORMAL LOW (ref 36.0–46.0)
Hemoglobin: 7 g/dL — ABNORMAL LOW (ref 12.0–15.0)
Hemoglobin: 9.3 g/dL — ABNORMAL LOW (ref 12.0–15.0)
MCH: 32.6 pg (ref 26.0–34.0)
MCH: 32.6 pg (ref 26.0–34.0)
MCHC: 33.5 g/dL (ref 30.0–36.0)
MCHC: 34.7 g/dL (ref 30.0–36.0)
MCV: 97.2 fL (ref 80.0–100.0)
MCV: 94 fL (ref 80.0–100.0)
Platelets: 67 10*3/uL — ABNORMAL LOW (ref 150–400)
Platelets: 88 10*3/uL — ABNORMAL LOW (ref 150–400)
RBC: 2.15 MIL/uL — ABNORMAL LOW (ref 3.87–5.11)
RBC: 2.85 MIL/uL — ABNORMAL LOW (ref 3.87–5.11)
RDW: 14.6 % (ref 11.5–15.5)
RDW: 13.8 % (ref 11.5–15.5)
WBC: 6.1 10*3/uL (ref 4.0–10.5)
WBC: 7.6 10*3/uL (ref 4.0–10.5)
nRBC: 0 % (ref 0.0–0.2)
nRBC: 0 % (ref 0.0–0.2)

## 2019-01-05 LAB — LACTATE DEHYDROGENASE: LDH: 285 U/L — ABNORMAL HIGH (ref 98–192)

## 2019-01-05 LAB — GLUCOSE, CAPILLARY
Glucose-Capillary: 100 mg/dL — ABNORMAL HIGH (ref 70–99)
Glucose-Capillary: 345 mg/dL — ABNORMAL HIGH (ref 70–99)
Glucose-Capillary: 283 mg/dL — ABNORMAL HIGH (ref 70–99)
Glucose-Capillary: 353 mg/dL — ABNORMAL HIGH (ref 70–99)

## 2019-01-05 LAB — PREPARE RBC (CROSSMATCH)

## 2019-01-05 LAB — COMPREHENSIVE METABOLIC PANEL
ALT: 34 U/L (ref 0–44)
AST: 17 U/L (ref 15–41)
Albumin: 2.9 g/dL — ABNORMAL LOW (ref 3.5–5.0)
Alkaline Phosphatase: 86 U/L (ref 38–126)
Anion gap: 7 (ref 5–15)
BUN: 21 mg/dL — ABNORMAL HIGH (ref 6–20)
CO2: 24 mmol/L (ref 22–32)
Calcium: 8.2 mg/dL — ABNORMAL LOW (ref 8.9–10.3)
Chloride: 105 mmol/L (ref 98–111)
Creatinine, Ser: 0.51 mg/dL (ref 0.44–1.00)
GFR calc Af Amer: >60 mL/min (ref >60)
GFR calc non Af Amer: >60 mL/min (ref >60)
Glucose, Bld: 174 mg/dL — ABNORMAL HIGH (ref 70–99)
Potassium: 3.8 mmol/L (ref 3.5–5.1)
Sodium: 136 mmol/L (ref 135–145)
Total Bilirubin: 0.9 mg/dL (ref 0.3–1.2)
Total Protein: 5.6 g/dL — ABNORMAL LOW (ref 6.5–8.1)

## 2019-01-05 LAB — RETICULOCYTES
Immature Retic Fract: 2.1 % — ABNORMAL LOW (ref 2.3–15.9)
RBC.: 2.15 MIL/uL — ABNORMAL LOW (ref 3.87–5.11)
Retic Count, Absolute: 44.7 10*3/uL (ref 19.0–186.0)
Retic Ct Pct: 2.1 % (ref 0.4–3.1)

## 2019-01-05 LAB — FERRITIN: Ferritin: 655 ng/mL — ABNORMAL HIGH (ref 11–307)

## 2019-01-05 LAB — C-REACTIVE PROTEIN: CRP: 4.7 mg/dL — ABNORMAL HIGH (ref <1.0)

## 2019-01-05 LAB — HEPARIN INDUCED PLATELET AB (HIT ANTIBODY): Heparin Induced Plt Ab: 0.173 OD (ref 0.000–0.400)

## 2019-01-05 LAB — D-DIMER, QUANTITATIVE: D-Dimer, Quant: 1.22 ug/mL-FEU — ABNORMAL HIGH (ref 0.00–0.50)

## 2019-01-05 MED ORDER — SODIUM CHLORIDE 0.9% IV SOLUTION: Freq: Once | INTRAVENOUS | Status: DC

## 2019-01-05 MED ORDER — INSULIN ASPART 100 UNIT/ML ~~LOC~~ SOLN
12.0000 [IU] | Freq: Three times a day (TID) | SUBCUTANEOUS | Status: DC
  Administered 2019-01-05 – 2019-01-08 (×10): 12 [IU] via SUBCUTANEOUS

## 2019-01-05 MED ORDER — INSULIN GLARGINE 100 UNIT/ML ~~LOC~~ SOLN
50.0000 [IU] | Freq: Two times a day (BID) | SUBCUTANEOUS | Status: DC
  Administered 2019-01-05 – 2019-01-08 (×7): 50 [IU] via SUBCUTANEOUS
  Filled 2019-01-05 (×8): qty 0.5

## 2019-01-05 NOTE — Progress Notes (Signed)
[facility] PROGRESS NOTE    Progress Note  Hannah Dickson  ZOX:[id number] DOB: 11/06/1965 DOA: 12/29/2018 PCP: Tula NakayamaKaram, Phillip Jerome, MD     Brief Narrative:   Hannah Dickson is an 53 y.o. female Spanish-speaking only with past medical history of autoimmune hemolytic anemia, diabetes mellitus and hypertension who presents to the ED after 3 to 4 days of abdominal pain nausea and vomiting, she relates no hematemesis, the daughter also reports jaundice which started 3 days prior to admission.  She also relate difficulty breathing coughing and diarrhea.  She was found to be tachycardic hypotensive with a hemoglobin of 3.2 a white count of 16.5 and a platelet count of 521 total bilirubin 6.5.  CT scan of the abdomen and pelvis was negative for any acute abnormalities, portable chest x-ray showed linear opacity in the left lung field.  She was transfused 2 units of packed red blood cells.  Assessment/Plan:   Acute hemolytic anemia/  Warm reactive antibody [facility](HCC): Her hemolysis likely due to COVID-19, she is not a candidate for rituximab in the setting of SARS-CoV-2 infection. She had a good response to IV high-dose dexamethasone. She is status post 4 units of packed red blood cells she was medicated before transfusion. Hemolobin today 7.0 transfuse 1 unit of packed red blood cells, check CBC post-transfusional. She has been off steroids since 09/2016.   Check haptoglobin, it will probably be high as it could act as an acute phase reactant. Her reticulocyte count is not having good response to the degree of her anemia, I discussed the case with Dr. Candise CheKale who he thinks she is having immunosuppression due to her COVID-19, we have reviewed her medications and there are no medications that she is getting which could contribute to bone marrow suppression, she is currently not hemolyzing as her bilirubin is normal we are checking haptoglobin .  She is also getting adequate  supplementation of vitamin B complex and folate. We will have to keep a close eye on her hemoglobin and give her supportive care when she stabilized she will need [facility] as her bone marrow suppression can play out over weeks.  COVID-19 viral infection: Currently saturating greater than 95% on room air. Continue oral steroids. Chest x-ray shows a new bilateral infiltrates more prominent on the right than on the left. Inflammatory markers today are improved.  Type 2 diabetes mellitus, uncontrolled (HCC) Blood glucose continues to be erratic, her A1c was 6.4. Blood glucose improved this morning.  Increase meal coverage.  Acute metabolic encephalopathy: Acute medication induced due to Benadryl, now resolved.  Hyponatremia: Likely due to hypovolemia resolved with IV fluid hydration.    Hypotension : Likely due to constant vomiting and decreased oral intake.  Resolved with IV fluid volume resuscitation and packed red blood cells transfusion. Blood pressure is stable today.  Hyperbilirubinemia Likely due to hemolysis.  New Thrombocytopenia: Discussed the case with Dr. Candise CheKale, who recommended to stop heparin, screen for HIIT, although her pretest probability is low.   Most likely cause of virus producing thrombocytopenia (bone marrow suppression), immature platelet function is elevated. Platelets today have stabilized, if the platelet count falls below 35,000, hematology recommended to start IVIG. She is currently on fondaparinux for DVT prophylaxis.   DVT prophylaxis: Fondaparinux Family Communication:daughter Disposition Plan/Barrier to D/C: once hemolysis controlled. Code Status:     Code Status Orders  (From admission, onward)         Start  Ordered   12/30/18 0238  Full code  Continuous     12/30/18 0237        Code Status History    Date Active Date Inactive Code Status Order ID Comments User Context   01/08/2016  1819 01/10/2016 2340 Full Code [id number]  Jani Gravel, MD Inpatient   02/17/2015 1714 02/20/2015 1555 Full Code [id number]  Kelvin Cellar, MD Inpatient   Advance Care Planning Activity        IV Access:    Peripheral IV   Procedures and diagnostic studies:   Dg Chest Port 1 View  Result Date: 01/03/2019 CLINICAL DATA:  Cough EXAM: PORTABLE CHEST 1 VIEW COMPARISON:  12/29/2018 FINDINGS: Heart size is normal. Mediastinal shadows are normal. There are patchy infiltrates in both lower lungs, left more than right, suggesting pneumonia. This could be viral or bacterial. Upper lungs are clear. No visible effusion. No bone abnormality. IMPRESSION: Patchy infiltrates in both lower lobes left more than right that could be viral pneumonia. Electronically Signed   By: Nelson Chimes M.D.   On: 01/03/2019 11:33     Medical Consultants:    None.  Anti-Infectives:   none  Subjective:    Hannah Dickson relates she feels tired but otherwise in good mood and no new complaints.  Objective:    Vitals:   01/04/19 1605 01/04/19 2134 01/04/19 2149 01/05/19 0511  BP: 100/60 101/61  (!) 96/57  Pulse: 76 80  74  Resp: 17 (!) 30  20  Temp: 98.7 F (37.1 C)  98 F (36.7 C) 98.2 F (36.8 C)  TempSrc:   Oral Oral  SpO2: 100% 100%  100%  Weight:      Height:       SpO2: 100 % O2 Flow Rate (L/min): 2 L/min   Intake/Output Summary (Last 24 hours) at 01/05/2019 0739 Last data filed at 01/04/2019 1400 Gross per 24 hour  Intake 480 ml  Output -  Net 480 ml   Filed Weights   12/29/18 1347  Weight: 68 kg    Exam: General exam: In no acute distress. Respiratory system: Good air movement and clear to auscultation. Cardiovascular system: S1 & S2 heard, RRR. No JVD, murmurs, rubs, gallops or clicks.  Gastrointestinal system: Abdomen is nondistended, soft and nontender.  Central nervous system: Alert and oriented. No focal neurological deficits. Extremities: No pedal edema. Skin:  No rashes, lesions or ulcers Psychiatry: Judgement and insight appear normal. Mood & affect appropriate.    Data Reviewed:    Labs: Basic Metabolic Panel: Recent Labs  Lab 01/01/19 0500 01/02/19 0400 01/03/19 1600 01/04/19 0420 01/05/19 0320  NA 137 136 132* 134* 136  K 4.2 5.1 4.7 4.2 3.8  CL 109 104 100 103 105  CO2 21* 23 22 22 24   GLUCOSE 259* 260* 316* 228* 174*  BUN 22* 20 22* 22* 21*  CREATININE 0.62 0.57 0.86 0.58 0.51  CALCIUM 8.3* 8.4* 8.1* 8.3* 8.2*   GFR Estimated Creatinine Clearance: 67.1 mL/min (by C-G formula based on SCr of 0.51 mg/dL). Liver Function Tests: Recent Labs  Lab 12/29/18 1459 12/29/18 1544 01/05/19 0320  AST 31  --  17  ALT 58*  --  34  ALKPHOS 99  --  86  BILITOT 6.5* 6.9* 0.9  PROT 6.8  --  5.6*  ALBUMIN 3.9  --  2.9*   Recent Labs  Lab 12/29/18 1459  LIPASE 33   Recent Labs  Lab 12/30/18 1245  AMMONIA 41*  Coagulation profile No results for input(s): INR, PROTIME in the last 168 hours. COVID-19 Labs  Recent Labs    01/03/19 0419 01/04/19 0420 01/05/19 0320  DDIMER 2.47* 1.37* 1.22*  FERRITIN 584* 590* 655*  LDH 388* 331* 285*  CRP 1.8* 6.7* 4.7*    Lab Results  Component Value Date   SARSCOV2NAA POSITIVE (A) 12/29/2018    CBC: Recent Labs  Lab 12/29/18 1531 12/30/18 1820  01/01/19 0500 01/02/19 0400 01/03/19 1600 01/04/19 0420 01/05/19 0320  WBC 16.5* 25.9*   < > 18.0* 10.1 9.6 8.8 6.1  NEUTROABS 12.5* 20.7*  --   --   --   --   --   --   HGB 3.2* 6.5*   < > 8.5* 8.9* 9.2* 8.2* 7.0*  HCT 9.5* 18.1*   < > 24.7* 25.8* 26.4* 23.4* 20.9*  MCV 96.0 101.1*   < > 98.8 99.2 95.7 96.3 97.2  PLT 521* 348   < > 151 112* 79* 68* 67*   < > = values in this interval not displayed.   Cardiac Enzymes: No results for input(s): CKTOTAL, CKMB, CKMBINDEX, TROPONINI in the last 168 hours. BNP (last 3 results) No results for input(s): PROBNP in the last 8760 hours. CBG: Recent Labs  Lab 01/03/19 2107 01/04/19  0735 01/04/19 1140 01/04/19 1604 01/04/19 2122  GLUCAP 344* 147* 341* 332* 270*   D-Dimer: Recent Labs    01/04/19 0420 01/05/19 0320  DDIMER 1.37* 1.22*   Hgb A1c: No results for input(s): HGBA1C in the last 72 hours. Lipid Profile: No results for input(s): CHOL, HDL, LDLCALC, TRIG, CHOLHDL, LDLDIRECT in the last 72 hours. Thyroid function studies: No results for input(s): TSH, T4TOTAL, T3FREE, THYROIDAB in the last 72 hours.  Invalid input(s): FREET3 Anemia work up: Recent Labs    01/04/19 0420 01/05/19 0320  FERRITIN 590* 655*  RETICCTPCT 2.8 2.1   Sepsis Labs: Recent Labs  Lab 01/02/19 0400 01/03/19 1600 01/04/19 0420 01/05/19 0320  WBC 10.1 9.6 8.8 6.1   Microbiology Recent Results (from the past 240 hour(s))  SARS Coronavirus 2 (CEPHEID - Performed in [facility] hospital lab), Hosp Order     Status: Abnormal   Collection Time: 12/29/18  3:22 PM   Specimen: Nasopharyngeal Swab  Result Value Ref Range Status   SARS Coronavirus 2 POSITIVE (A) NEGATIVE Final    Comment: RESULT CALLED TO, READ BACK BY AND VERIFIED WITH: WESTON,L ON 12/29/18 AT 1855 BY LOY,C (NOTE) If result is NEGATIVE SARS-CoV-2 target nucleic acids are NOT DETECTED. The SARS-CoV-2 RNA is generally detectable in upper and lower  respiratory specimens during the acute phase of infection. The lowest  concentration of SARS-CoV-2 viral copies this assay can detect is 250  copies / mL. A negative result does not preclude SARS-CoV-2 infection  and should not be used as the sole basis for treatment or other  patient management decisions.  A negative result may occur with  improper specimen collection / handling, submission of specimen other  than nasopharyngeal swab, presence of viral mutation(s) within the  areas targeted by this assay, and inadequate number of viral copies  (<250 copies / mL). A negative result must be combined with clinical  observations, patient history, and epidemiological  information. If result is POSITIVE SARS-CoV-2 target nucleic acids are DETECTED.  The SARS-CoV-2 RNA is generally detectable in upper and lower  respiratory specimens during the acute phase of infection.  Positive  results are indicative of active infection with SARS-CoV-2.  Clinical  correlation with patient history and other diagnostic information is  necessary to determine patient infection status.  Positive results do  not rule out bacterial infection or co-infection with other viruses. If result is PRESUMPTIVE POSTIVE SARS-CoV-2 nucleic acids MAY BE PRESENT.   A presumptive positive result was obtained on the submitted specimen  and confirmed on repeat testing.  While 2019 novel coronavirus  (SARS-CoV-2) nucleic acids may be present in the submitted sample  additional confirmatory testing may be necessary for epidemiological  and / or clinical management purposes  to differentiate between  SARS-CoV-2 and other Sarbecovirus currently known to infect humans.  If clinically indicated additional testing with an alternate test  methodology [id number]) i s advised. The SARS-CoV-2 RNA is generally  detectable in upper and lower respiratory specimens during the acute  phase of infection. The expected result is Negative. Fact Sheet for Patients:  BoilerBrush.com.cyhttps://www.fda.gov/media/136312/download Fact Sheet for Healthcare Providers: https://pope.com/https://www.fda.gov/media/136313/download This test is not yet approved or cleared by the [place] FDA and has been authorized for detection and/or diagnosis of SARS-CoV-2 by FDA under an Emergency Use Authorization (EUA).  This EUA will remain in effect (meaning this test can be used) for the duration of the COVID-19 declaration under Section 564(b)(1) of the Act, 21 U.S.C. section 360bbb-3(b)(1), unless the authorization is terminated or revoked sooner. Performed at [facility], [address] [place], [place] [address]   MRSA PCR Screening     Status: None    Collection Time: 12/30/18  6:53 AM   Specimen: Nasal Mucosa; Nasopharyngeal  Result Value Ref Range Status   MRSA by PCR NEGATIVE NEGATIVE Final    Comment:        The GeneXpert MRSA Assay (FDA approved for NASAL specimens only), is one component of a comprehensive MRSA colonization surveillance program. It is not intended to diagnose MRSA infection nor to guide or monitor treatment for MRSA infections. Performed at [facility], [address] [place], [place] [address]      Medications:   . sodium chloride   Intravenous Once  . atorvastatin  20 mg Oral Daily  . B-complex with vitamin C  1 tablet Oral Daily  . Chlorhexidine Gluconate Cloth  6 each Topical Daily  . dexamethasone  6 mg Oral Q breakfast  . folic acid  5 mg Oral Daily  . fondaparinux (ARIXTRA) injection  2.5 mg Subcutaneous Daily  . insulin aspart  0-20 Units Subcutaneous TID WC  . insulin aspart  0-5 Units Subcutaneous QHS  . insulin aspart  6 Units Subcutaneous TID WC  . insulin glargine  40 Units Subcutaneous BID  . pantoprazole  40 mg Oral Daily  . pneumococcal 23 valent vaccine  0.5 mL Intramuscular Tomorrow-1000  . sodium chloride flush  3 mL Intravenous Q12H   Continuous Infusions: . sodium chloride    . sodium chloride        LOS: 7 days   Hannah Dickson  [facility]  01/05/2019, 7:39 AM

## 2019-01-05 NOTE — Progress Notes (Signed)
Called to give update to pt's daughter, [place]. Provided information on updated plan of care. Questions answered.

## 2019-01-06 LAB — COMPREHENSIVE METABOLIC PANEL
ALT: 37 U/L (ref 0–44)
AST: 16 U/L (ref 15–41)
Albumin: 3 g/dL — ABNORMAL LOW (ref 3.5–5.0)
Alkaline Phosphatase: 80 U/L (ref 38–126)
Anion gap: 7 (ref 5–15)
BUN: 19 mg/dL (ref 6–20)
CO2: 25 mmol/L (ref 22–32)
Calcium: 8.3 mg/dL — ABNORMAL LOW (ref 8.9–10.3)
Chloride: 105 mmol/L (ref 98–111)
Creatinine, Ser: 0.5 mg/dL (ref 0.44–1.00)
GFR calc Af Amer: >60 mL/min (ref >60)
GFR calc non Af Amer: >60 mL/min (ref >60)
Glucose, Bld: 128 mg/dL — ABNORMAL HIGH (ref 70–99)
Potassium: 3.7 mmol/L (ref 3.5–5.1)
Sodium: 137 mmol/L (ref 135–145)
Total Bilirubin: 1.1 mg/dL (ref 0.3–1.2)
Total Protein: 5.6 g/dL — ABNORMAL LOW (ref 6.5–8.1)

## 2019-01-06 LAB — GLUCOSE, CAPILLARY
Glucose-Capillary: 95 mg/dL (ref 70–99)
Glucose-Capillary: 359 mg/dL — ABNORMAL HIGH (ref 70–99)
Glucose-Capillary: 320 mg/dL — ABNORMAL HIGH (ref 70–99)

## 2019-01-06 LAB — CBC
HCT: 25.2 % — ABNORMAL LOW (ref 36.0–46.0)
Hemoglobin: 8.7 g/dL — ABNORMAL LOW (ref 12.0–15.0)
MCH: 32.1 pg (ref 26.0–34.0)
MCHC: 34.5 g/dL (ref 30.0–36.0)
MCV: 93 fL (ref 80.0–100.0)
Platelets: 93 10*3/uL — ABNORMAL LOW (ref 150–400)
RBC: 2.71 MIL/uL — ABNORMAL LOW (ref 3.87–5.11)
RDW: 14.3 % (ref 11.5–15.5)
WBC: 6.8 10*3/uL (ref 4.0–10.5)
nRBC: 0 % (ref 0.0–0.2)

## 2019-01-06 LAB — RETICULOCYTES
Immature Retic Fract: 3.3 % (ref 2.3–15.9)
RBC.: 2.71 MIL/uL — ABNORMAL LOW (ref 3.87–5.11)
Retic Count, Absolute: 32.8 10*3/uL (ref 19.0–186.0)
Retic Ct Pct: 1.2 % (ref 0.4–3.1)

## 2019-01-06 LAB — BPAM RBC
Blood Product Expiration Date: 202008132359
ISSUE DATE / TIME: 202007211503
Unit Type and Rh: 9500

## 2019-01-06 LAB — TYPE AND SCREEN
ABO/RH(D): O POS
Antibody Screen: POSITIVE
DAT, IgG: POSITIVE
Unit division: 0

## 2019-01-06 LAB — C-REACTIVE PROTEIN: CRP: 2.4 mg/dL — ABNORMAL HIGH (ref <1.0)

## 2019-01-06 LAB — HAPTOGLOBIN: Haptoglobin: 30 mg/dL — ABNORMAL LOW (ref 33–346)

## 2019-01-06 LAB — LACTATE DEHYDROGENASE: LDH: 250 U/L — ABNORMAL HIGH (ref 98–192)

## 2019-01-06 LAB — FERRITIN: Ferritin: 662 ng/mL — ABNORMAL HIGH (ref 11–307)

## 2019-01-06 NOTE — Progress Notes (Signed)
PROGRESS NOTE                                                                                                                                                                                                             Patient Demographics:    Hannah Dickson, is a 53 y.o. female, DOB - 03-22-1966, VBT:[id number]  Outpatient Primary MD for the patient is Debria Garret Norris Cross, MD    LOS - 8  Admit date - 12/29/2018    Chief Complaint  Patient presents with  . Nausea       Brief Narrative  Hannah Dickson is an 53 y.o. female Spanish-speaking only with past medical history of autoimmune hemolytic anemia, diabetes mellitus and hypertension who presents to the ED after 3 to 4 days of abdominal pain nausea and vomiting.  She had exposure to COVID positive husband for a little while, in the ER she was found to have a hemoglobin of 3.2 and an elevated bilirubin of 6.5.  Her work-up was suggestive of COVID-19 infection along with acute decompensation of her chronic hemolytic anemia and she was admitted.   Subjective:    Hannah Dickson today has, No headache, No chest pain, No abdominal pain - No Nausea, No new weakness tingling or numbness,  No Cough - SOB.     Assessment  & Plan :     1.  Acute Covid 19 Viral Pneumonitis during the ongoing 2020 Covid 19 Pandemic - no hypoxia or oxygen demand, stable inflammatory markers, continue on steroids and monitor.  She was encouraged to sit in chair use flutter valve for pulmonary toiletry in the daytime.  COVID-19 Labs  Recent Labs    01/04/19 0420 01/05/19 0320 01/06/19 0430  DDIMER 1.37* 1.22*  --   FERRITIN 590* 655* 662*  LDH 331* 285* 250*  CRP 6.7* 4.7* 2.4*    Lab Results  Component Value Date   SARSCOV2NAA POSITIVE (A) 12/29/2018     Hepatic Function Latest Ref Rng & Units 01/06/2019 01/05/2019 12/29/2018  Total Protein 6.5 - 8.1 g/dL  5.6(L) 5.6(L) -  Albumin 3.5 - 5.0 g/dL 3.0(L) 2.9(L) -  AST 15 - 41 U/L 16 17 -  ALT 0 - 44 U/L 37 34 -  Alk Phosphatase 38 - 126 U/L 80 86 -  Total Bilirubin 0.3 - 1.2 mg/dL 1.1  0.9 6.9(H)  Bilirubin, Direct 0.0 - 0.2 mg/dL - - 1.4(H)     No results found for: BNP    2.  Acute decompensation of chronic hemolytic anemia causing hemolytic blood loss requiring 4 units of packed RBC transfusion, she received 1 unit on 01/05/2019, currently no signs of ongoing hemolysis, H&H stable, platelets stable, continue steroids.  Continue monitoring haptoglobin, LDH, bilirubin and H&H.  Post discharge will follow with [facility].  Son requested to arrange for outpatient follow-up within a week.  3.  Elevated total bilirubin causing jaundice.  Due to hemolysis.  Currently resolved with supportive care.  4.  Mild thrombocytopenia.  Likely due to viral infection, HIT panel is pending, counts are stable no signs of bleeding for now.  Previous MD discussed the case with local hematologist Dr. Irene Limbo who recommended IVIG if platelet counts drop below 35,000.  5.  Hypotension and hyponatremia.  Resolved after packed RBC transfusion and IV fluids.   6.  DM type II.  A1c 6.4.  Currently on sliding scale.  CBG (last 3)  Recent Labs    01/05/19 2031 01/06/19 0731 01/06/19 1130  GLUCAP 353* 95 359*      Condition - Extremely Guarded  Family Communication  : Updated by me personally.  Code Status :  Full  Diet :   Diet Order            Diet regular Room service appropriate? Yes; Fluid consistency: Thin  Diet effective now               Disposition Plan  : Home 1-2 days  Consults  : Previous MD consulted Dr. Irene Limbo hematology.  Procedures  :    CT Abd - non acute   PUD Prophylaxis :  PPI  DVT Prophylaxis  :   SCDs    Lab Results  Component Value Date   PLT 93 (L) 01/06/2019    Inpatient Medications  Scheduled Meds: . sodium chloride   Intravenous Once  . sodium  chloride   Intravenous Once  . atorvastatin  20 mg Oral Daily  . B-complex with vitamin C  1 tablet Oral Daily  . Chlorhexidine Gluconate Cloth  6 each Topical Daily  . dexamethasone  6 mg Oral Q breakfast  . folic acid  5 mg Oral Daily  . fondaparinux (ARIXTRA) injection  2.5 mg Subcutaneous Daily  . insulin aspart  0-20 Units Subcutaneous TID WC  . insulin aspart  0-5 Units Subcutaneous QHS  . insulin aspart  12 Units Subcutaneous TID WC  . insulin glargine  50 Units Subcutaneous BID  . pantoprazole  40 mg Oral Daily  . sodium chloride flush  3 mL Intravenous Q12H   Continuous Infusions: . sodium chloride    . sodium chloride     PRN Meds:.sodium chloride, acetaminophen **OR** acetaminophen, ondansetron (ZOFRAN) IV, polyethylene glycol, sodium chloride flush, traMADol, traZODone  Antibiotics  :    Anti-infectives (From admission, onward)   None       Time Spent in minutes  30   Lala Lund M.D on 01/06/2019 at 11:59 AM  To page go to [email or web address] - password [facility]  [facility] -  Office  [phone]    See all Orders from today for further details    Objective:   Vitals:   01/06/19 0004 01/06/19 0005 01/06/19 0500 01/06/19 0733  BP:  1'00/63 93/61 99/67 '$  Pulse:  70 67 71  Resp:  (!) 22 16 18  Temp: 98 F (36.7 C)  98.4 F (36.9 C) 98.4 F (36.9 C)  TempSrc: Oral  Oral Oral  SpO2:  100% 100% 100%  Weight:      Height:        Wt Readings from Last 3 Encounters:  12/29/18 68 kg  01/10/16 62 kg  12/22/15 61.5 kg     Intake/Output Summary (Last 24 hours) at 01/06/2019 1159 Last data filed at 01/05/2019 1935 Gross per 24 hour  Intake 339 ml  Output -  Net 339 ml     Physical Exam  Awake Alert, Oriented X 3, No new F.N deficits, Normal affect [place].AT,PERRAL Supple Neck,No JVD, No cervical lymphadenopathy appriciated.  Symmetrical Chest wall movement, Good air movement bilaterally, CTAB RRR,No Gallops,Rubs or new Murmurs, No Parasternal  Heave +ve B.Sounds, Abd Soft, No tenderness, No organomegaly appriciated, No rebound - guarding or rigidity. No Cyanosis, Clubbing or edema, No new Rash or bruise     Data Review:    CBC Recent Labs  Lab 12/30/18 1820  01/03/19 1600 01/04/19 0420 01/05/19 0320 01/05/19 2155 01/06/19 0430  WBC 25.9*   < > 9.6 8.8 6.1 7.6 6.8  HGB 6.5*   < > 9.2* 8.2* 7.0* 9.3* 8.7*  HCT 18.1*   < > 26.4* 23.4* 20.9* 26.8* 25.2*  PLT 348   < > 79* 68* 67* 88* 93*  MCV 101.1*   < > 95.7 96.3 97.2 94.0 93.0  MCH 36.3*   < > 33.3 33.7 32.6 32.6 32.1  MCHC 35.9   < > 34.8 35.0 33.5 34.7 34.5  RDW Not Measured   < > 15.8* 14.8 14.6 13.8 14.3  LYMPHSABS 3.0  --   --   --   --   --   --   MONOABS 1.1*  --   --   --   --   --   --   EOSABS 0.0  --   --   --   --   --   --   BASOSABS 0.0  --   --   --   --   --   --    < > = values in this interval not displayed.    Chemistries  Recent Labs  Lab 01/02/19 0400 01/03/19 1600 01/04/19 0420 01/05/19 0320 01/06/19 0430  NA 136 132* 134* 136 137  K 5.1 4.7 4.2 3.8 3.7  CL 104 100 103 105 105  CO2 '23 22 22 24 25  '$ GLUCOSE 260* 316* 228* 174* 128*  BUN 20 22* 22* 21* 19  CREATININE 0.57 0.86 0.58 0.51 0.50  CALCIUM 8.4* 8.1* 8.3* 8.2* 8.3*  AST  --   --   --  17 16  ALT  --   --   --  34 37  ALKPHOS  --   --   --  86 80  BILITOT  --   --   --  0.9 1.1   ------------------------------------------------------------------------------------------------------------------ No results for input(s): CHOL, HDL, LDLCALC, TRIG, CHOLHDL, LDLDIRECT in the last 72 hours.  Lab Results  Component Value Date   HGBA1C 6.4 (H) 12/29/2018   ------------------------------------------------------------------------------------------------------------------ No results for input(s): TSH, T4TOTAL, T3FREE, THYROIDAB in the last 72 hours.  Invalid input(s): FREET3  Cardiac Enzymes No results for input(s): CKMB, TROPONINI, MYOGLOBIN in the last 168 hours.  Invalid  input(s): CK ------------------------------------------------------------------------------------------------------------------ No results found for: BNP  Micro Results Recent Results (from the past 240 hour(s))  SARS Coronavirus 2 (CEPHEID -  Performed in [facility] hospital lab), Hosp Order     Status: Abnormal   Collection Time: 12/29/18  3:22 PM   Specimen: Nasopharyngeal Swab  Result Value Ref Range Status   SARS Coronavirus 2 POSITIVE (A) NEGATIVE Final    Comment: RESULT CALLED TO, READ BACK BY AND VERIFIED WITH: WESTON,L ON 12/29/18 AT 1855 BY LOY,C (NOTE) If result is NEGATIVE SARS-CoV-2 target nucleic acids are NOT DETECTED. The SARS-CoV-2 RNA is generally detectable in upper and lower  respiratory specimens during the acute phase of infection. The lowest  concentration of SARS-CoV-2 viral copies this assay can detect is 250  copies / mL. A negative result does not preclude SARS-CoV-2 infection  and should not be used as the sole basis for treatment or other  patient management decisions.  A negative result may occur with  improper specimen collection / handling, submission of specimen other  than nasopharyngeal swab, presence of viral mutation(s) within the  areas targeted by this assay, and inadequate number of viral copies  (<250 copies / mL). A negative result must be combined with clinical  observations, patient history, and epidemiological information. If result is POSITIVE SARS-CoV-2 target nucleic acids are DETECTED.  The SARS-CoV-2 RNA is generally detectable in upper and lower  respiratory specimens during the acute phase of infection.  Positive  results are indicative of active infection with SARS-CoV-2.  Clinical  correlation with patient history and other diagnostic information is  necessary to determine patient infection status.  Positive results do  not rule out bacterial infection or co-infection with other viruses. If result is PRESUMPTIVE POSTIVE  SARS-CoV-2 nucleic acids MAY BE PRESENT.   A presumptive positive result was obtained on the submitted specimen  and confirmed on repeat testing.  While 2019 novel coronavirus  (SARS-CoV-2) nucleic acids may be present in the submitted sample  additional confirmatory testing may be necessary for epidemiological  and / or clinical management purposes  to differentiate between  SARS-CoV-2 and other Sarbecovirus currently known to infect humans.  If clinically indicated additional testing with an alternate test  methodology [phone]) i s advised. The SARS-CoV-2 RNA is generally  detectable in upper and lower respiratory specimens during the acute  phase of infection. The expected result is Negative. Fact Sheet for Patients:  StrictlyIdeas.no Fact Sheet for Healthcare Providers: BankingDealers.co.za This test is not yet approved or cleared by the [place] FDA and has been authorized for detection and/or diagnosis of SARS-CoV-2 by FDA under an Emergency Use Authorization (EUA).  This EUA will remain in effect (meaning this test can be used) for the duration of the COVID-19 declaration under Section 564(b)(1) of the Act, 21 U.S.C. section 360bbb-3(b)(1), unless the authorization is terminated or revoked sooner. Performed at [facility], [address], [place], [place] [address]   MRSA PCR Screening     Status: None   Collection Time: 12/30/18  6:53 AM   Specimen: Nasal Mucosa; Nasopharyngeal  Result Value Ref Range Status   MRSA by PCR NEGATIVE NEGATIVE Final    Comment:        The GeneXpert MRSA Assay (FDA approved for NASAL specimens only), is one component of a comprehensive MRSA colonization surveillance program. It is not intended to diagnose MRSA infection nor to guide or monitor treatment for MRSA infections. Performed at [facility], [place] [address], , [place] [address]     Radiology Reports  Ct Abdomen Pelvis W Contrast  Result Date: 12/29/2018 CLINICAL DATA:  Pt  has been nauseous for the last 4 days. Has thrown up 3-4 times in the last 24 hours. Is having abdominal pain and is jaundiced. No history of liver disease. EXAM: CT ABDOMEN AND PELVIS WITH CONTRAST TECHNIQUE: Multidetector CT imaging of the abdomen and pelvis was performed using the standard protocol following bolus administration of intravenous contrast. CONTRAST:  182m OMNIPAQUE IOHEXOL 300 MG/ML  SOLN COMPARISON:  None. FINDINGS: Lower chest: No acute abnormality. Hepatobiliary: No focal liver abnormality is seen. No gallstones, gallbladder wall thickening, or biliary dilatation. Pancreas: Unremarkable. No pancreatic ductal dilatation or surrounding inflammatory changes. Spleen: Normal in size without focal abnormality. Adrenals/Urinary Tract: Adrenal glands are unremarkable. Kidneys are normal, without renal calculi, focal lesion, or hydronephrosis. Bladder is unremarkable. Stomach/Bowel: Stomach is within normal limits. Appendix appears normal. No evidence of bowel wall thickening, distention, or inflammatory changes. Vascular/Lymphatic: No significant vascular findings are present. No enlarged abdominal or pelvic lymph nodes. Reproductive: Uterus and bilateral adnexa are unremarkable. Other: No abdominal wall hernia or abnormality. No abdominopelvic ascites. Musculoskeletal: No acute osseous abnormality. No aggressive osseous lesion. Bilateral facet arthropathy at L5-S1. IMPRESSION: 1. No acute abdominal or pelvic pathology. 2. Normal appendix. Electronically Signed   By: HKathreen Devoid  On: 12/29/2018 16:47   Dg Chest Port 1 View  Result Date: 01/03/2019 CLINICAL DATA:  Cough EXAM: PORTABLE CHEST 1 VIEW COMPARISON:  12/29/2018 FINDINGS: Heart size is normal. Mediastinal shadows are normal. There are patchy infiltrates in both lower lungs, left more than right, suggesting pneumonia. This could be viral or bacterial. Upper lungs  are clear. No visible effusion. No bone abnormality. IMPRESSION: Patchy infiltrates in both lower lobes left more than right that could be viral pneumonia. Electronically Signed   By: MNelson ChimesM.D.   On: 01/03/2019 11:33   Dg Chest Portable 1 View  Result Date: 12/29/2018 CLINICAL DATA:  Nausea and emesis. Shortness of breath and fever. EXAM: PORTABLE CHEST 1 VIEW COMPARISON:  December 29, 2018 FINDINGS: Cardiomediastinal silhouette is normal. Mediastinal contours appear intact. Low lung volumes. Linear opacity in the left lower lung field. Osseous structures are without acute abnormality. Soft tissues are grossly normal. IMPRESSION: 1. Low lung volumes. 2. Linear opacity in the left lower lung field may represent atelectasis or airspace consolidation. Electronically Signed   By: DFidela SalisburyM.D.   On: 12/29/2018 18:41

## 2019-01-06 NOTE — Evaluation (Signed)
Physical Therapy Evaluation and Discharge Patient Details Name: Hannah Dickson MRN: [id number] DOB: 11-Nov-1965 Today's Date: 01/06/2019   History of Present Illness  53 y.o. female Spanish-speaking only with past medical history of autoimmune hemolytic anemia, diabetes mellitus and hypertension who presents to the ED after 3 to 4 days of abdominal pain nausea and vomiting, she relates no hematemesis, the daughter also reports jaundice which started 3 days prior to admission.  She also relate difficulty breathing coughing and diarrhea.  She was found to be tachycardic hypotensive with a hemoglobin of 3.2. Transfused. Metabolic encephalopathy due to benadryl.   Clinical Impression   Patient evaluated by Physical Therapy with no further acute PT needs identified. All education has been completed and the patient has no further questions.  PT is signing off. Thank you for this referral.     Follow Up Recommendations No PT follow up    Equipment Recommendations  None recommended by PT    Recommendations for Other Services       Precautions / Restrictions Precautions Precautions: None      Mobility  Bed Mobility Overal bed mobility: Independent                Transfers Overall transfer level: Independent                  Ambulation/Gait Ambulation/Gait assistance: Independent Gait Distance (Feet): 50 Feet Assistive device: None Gait Pattern/deviations: WFL(Within Functional Limits)        Stairs            Wheelchair Mobility    Modified Rankin (Stroke Patients Only)       Balance Overall balance assessment: Independent                               Standardized Balance Assessment Standardized Balance Assessment : Berg Balance Test Berg Balance Test Sit to Stand: Able to stand without using hands and stabilize independently Standing Unsupported: Able to stand safely 2 minutes Sitting with Back Unsupported but Feet  Supported on Floor or Stool: Able to sit safely and securely 2 minutes Stand to Sit: Sits safely with minimal use of hands Transfers: Able to transfer safely, minor use of hands Standing Unsupported with Eyes Closed: Able to stand 10 seconds safely Standing Ubsupported with Feet Together: Able to place feet together independently and stand 1 minute safely From Standing, Reach Forward with Outstretched Arm: Can reach forward >12 cm safely (5") From Standing Position, Pick up Object from Floor: Able to pick up shoe safely and easily From Standing Position, Turn to Look Behind Over each Shoulder: Looks behind from both sides and weight shifts well Turn 360 Degrees: Able to turn 360 degrees safely but slowly Standing Unsupported, One Foot in Front: Able to plae foot ahead of the other independently and hold 30 seconds Standing on One Leg: Tries to lift leg/unable to hold 3 seconds but remains standing independently         Pertinent Vitals/Pain Pain Assessment: No/denies pain    Home Living Family/patient expects to be discharged to:: Private residence Living Arrangements: Children(19, 54) Available Help at Discharge: Family Type of Home: House Home Access: Stairs to enter Entrance Stairs-Rails: Psychiatric nurse of Steps: 2 Home Layout: One level Home Equipment: Walker - 2 wheels      Prior Function Level of Independence: Independent  Hand Dominance        Extremity/Trunk Assessment   Upper Extremity Assessment Upper Extremity Assessment: Overall WFL for tasks assessed    Lower Extremity Assessment Lower Extremity Assessment: Overall WFL for tasks assessed    Cervical / Trunk Assessment Cervical / Trunk Assessment: Normal  Communication   Communication: Prefers language other than [place];Interpreter utilized(Andrea [phone])  Cognition Arousal/Alertness: Awake/alert Behavior During Therapy: WFL for tasks assessed/performed Overall  Cognitive Status: Within Functional Limits for tasks assessed                                        General Comments      Exercises     Assessment/Plan    PT Assessment Patent does not need any further PT services  PT Problem List         PT Treatment Interventions      PT Goals (Current goals can be found in the Care Plan section)  Acute Rehab PT Goals Patient Stated Goal: return home to family PT Goal Formulation: All assessment and education complete, DC therapy    Frequency     Barriers to discharge        Co-evaluation               AM-PAC PT "6 Clicks" Mobility  Outcome Measure Help needed turning from your back to your side while in a flat bed without using bedrails?: None Help needed moving from lying on your back to sitting on the side of a flat bed without using bedrails?: None Help needed moving to and from a bed to a chair (including a wheelchair)?: None Help needed standing up from a chair using your arms (e.g., wheelchair or bedside chair)?: None Help needed to walk in hospital room?: None Help needed climbing 3-5 steps with a railing? : None 6 Click Score: 24    End of Session   Activity Tolerance: Patient tolerated treatment well Patient left: in chair;with call bell/phone within reach Nurse Communication: Mobility status;Other (comment)(no PT needs) PT Visit Diagnosis: Muscle weakness (generalized) (M62.81)    Time: [id number] PT Time Calculation (min) (ACUTE ONLY): 20 min   Charges:   PT Evaluation $PT Eval Low Complexity: 1 Low            Computer Sciences CorporationLynn P Karista Aispuro, PT 01/06/2019, 1:46 PM

## 2019-01-07 LAB — C-REACTIVE PROTEIN: CRP: 1.3 mg/dL — ABNORMAL HIGH (ref <1.0)

## 2019-01-07 LAB — HAPTOGLOBIN: Haptoglobin: 31 mg/dL — ABNORMAL LOW (ref 33–346)

## 2019-01-07 LAB — CBC
HCT: 24.7 % — ABNORMAL LOW (ref 36.0–46.0)
Hemoglobin: 8.6 g/dL — ABNORMAL LOW (ref 12.0–15.0)
MCH: 32.7 pg (ref 26.0–34.0)
MCHC: 34.8 g/dL (ref 30.0–36.0)
MCV: 93.9 fL (ref 80.0–100.0)
Platelets: 142 10*3/uL — ABNORMAL LOW (ref 150–400)
RBC: 2.63 MIL/uL — ABNORMAL LOW (ref 3.87–5.11)
RDW: 14.1 % (ref 11.5–15.5)
WBC: 8.3 10*3/uL (ref 4.0–10.5)
nRBC: 0 % (ref 0.0–0.2)

## 2019-01-07 LAB — COMPREHENSIVE METABOLIC PANEL
ALT: 49 U/L — ABNORMAL HIGH (ref 0–44)
AST: 19 U/L (ref 15–41)
Albumin: 3.1 g/dL — ABNORMAL LOW (ref 3.5–5.0)
Alkaline Phosphatase: 84 U/L (ref 38–126)
Anion gap: 9 (ref 5–15)
BUN: 19 mg/dL (ref 6–20)
CO2: 25 mmol/L (ref 22–32)
Calcium: 8.5 mg/dL — ABNORMAL LOW (ref 8.9–10.3)
Chloride: 104 mmol/L (ref 98–111)
Creatinine, Ser: 0.52 mg/dL (ref 0.44–1.00)
GFR calc Af Amer: >60 mL/min (ref >60)
GFR calc non Af Amer: >60 mL/min (ref >60)
Glucose, Bld: 76 mg/dL (ref 70–99)
Potassium: 3.8 mmol/L (ref 3.5–5.1)
Sodium: 138 mmol/L (ref 135–145)
Total Bilirubin: 0.7 mg/dL (ref 0.3–1.2)
Total Protein: 5.8 g/dL — ABNORMAL LOW (ref 6.5–8.1)

## 2019-01-07 LAB — RETICULOCYTES
Immature Retic Fract: 5.1 % (ref 2.3–15.9)
RBC.: 2.63 MIL/uL — ABNORMAL LOW (ref 3.87–5.11)
Retic Count, Absolute: 19.2 10*3/uL (ref 19.0–186.0)
Retic Ct Pct: 0.7 % (ref 0.4–3.1)

## 2019-01-07 LAB — GLUCOSE, CAPILLARY
Glucose-Capillary: 367 mg/dL — ABNORMAL HIGH (ref 70–99)
Glucose-Capillary: 83 mg/dL (ref 70–99)
Glucose-Capillary: 311 mg/dL — ABNORMAL HIGH (ref 70–99)

## 2019-01-07 LAB — LACTATE DEHYDROGENASE: LDH: 250 U/L — ABNORMAL HIGH (ref 98–192)

## 2019-01-07 LAB — FERRITIN: Ferritin: 676 ng/mL — ABNORMAL HIGH (ref 11–307)

## 2019-01-07 LAB — SEROTONIN RELEASE ASSAY (SRA)
SRA .2 IU/mL UFH Ser-aCnc: <1 % (ref 0–20)
SRA 100IU/mL UFH Ser-aCnc: <1 % (ref 0–20)

## 2019-01-07 NOTE — Progress Notes (Signed)
ANTICOAGULATION CONSULT NOTE - Follow-up Consult  Pharmacy Consult for Arixtra Indication: VTE prophylaxis  No Known Allergies  Patient Measurements: Height: 4\' 10"  (147.3 cm) Weight: 150 lb (68 kg) IBW/kg (Calculated) : 40.9  Vital Signs: Temp: 98.5 F (36.9 C) (07/23 0813) Temp Source: Oral (07/23 0429) BP: 99/66 (07/23 0813) Pulse Rate: 66 (07/23 0813)  Labs: Recent Labs    01/05/19 0320 01/05/19 2155 01/06/19 0430 01/07/19 0300  HGB 7.0* 9.3* 8.7* 8.6*  HCT 20.9* 26.8* 25.2* 24.7*  PLT 67* 88* 93* 142*  CREATININE 0.51  --  0.50 0.52    Estimated Creatinine Clearance: 67.1 mL/min (by C-G formula based on SCr of 0.52 mg/dL).   Assessment: 53 y.o. admitted with COVID. Noted pt with hemolytic anemia. Pt with new thrombocytopenia. Plt 521 on admission (7/14), down to 68. Pt receiving Lovenox 40mg  daily 7/17-7/20. Lovenox held and HIT Ab ordered.   "Dr. Irene Limbo (hem/onc) recommends to stop heparin, he is concerned about virus producing the thrombocytopenia, check for HIT, for immature platelet function and continue to monitor platelet count closely if they fall below 35,000 he recommended to start IVIG. Also start fondaparinux for DVT prophylaxis as per his request"  4T score = 2 (correlates with low risk for HIT)  7/23 Heparin antibody and SRA negative. Plt back up to 142.  Goal of Therapy:  VTE prophylaxis Monitor platelets by anticoagulation protocol: Yes   Plan:  Arixtra 2.5mg  SQ daily Noted plan to likely d/c home tomorrow  Sherlon Handing, PharmD, BCPS Clinical pharmacist 01/07/2019,10:47 AM

## 2019-01-07 NOTE — Progress Notes (Signed)
Patient stated I didn't need to call and update her son. She stated that she had already spoke to her son.

## 2019-01-07 NOTE — Progress Notes (Signed)
Spoke to patient's son per patient request to give update on patient. He appreciated the call and care of his Mom.

## 2019-01-07 NOTE — Progress Notes (Signed)
PROGRESS NOTE                                                                                                                                                                                                             Patient Demographics:    Hannah Dickson, is a 53 y.o. female, DOB - 1965-10-02, DEK:[id number]  Outpatient Primary MD for the patient is Debria Garret Norris Cross, MD    LOS - 9  Admit date - 12/29/2018    Chief Complaint  Patient presents with  . Nausea       Brief Narrative  Hannah Dickson is an 53 y.o. female Spanish-speaking only with past medical history of autoimmune hemolytic anemia, diabetes mellitus and hypertension who presents to the ED after 3 to 4 days of abdominal pain nausea and vomiting.  She had exposure to COVID positive husband for a little while, in the ER she was found to have a hemoglobin of 3.2 and an elevated bilirubin of 6.5.  Her work-up was suggestive of COVID-19 infection along with acute decompensation of her chronic hemolytic anemia and she was admitted.   Subjective:    Patient in bed, appears comfortable, denies any headache, no fever, no chest pain or pressure, no shortness of breath , no abdominal pain. No focal weakness.    Assessment  & Plan :     1.  Acute Covid 19 Viral Pneumonitis during the ongoing 2020 Covid 19 Pandemic - no hypoxia or oxygen demand, stable inflammatory markers, continue on steroids and monitor.  She was encouraged to sit in chair use flutter valve for pulmonary toiletry in the daytime.  COVID-19 Labs  Recent Labs    01/05/19 0320 01/06/19 0430 01/07/19 0300  DDIMER 1.22*  --   --   FERRITIN 655* 662* 676*  LDH 285* 250* 250*  CRP 4.7* 2.4* 1.3*    Lab Results  Component Value Date   SARSCOV2NAA POSITIVE (A) 12/29/2018     Hepatic Function Latest Ref Rng & Units 01/07/2019 01/06/2019 01/05/2019  Total Protein 6.5 - 8.1  g/dL 5.8(L) 5.6(L) 5.6(L)  Albumin 3.5 - 5.0 g/dL 3.1(L) 3.0(L) 2.9(L)  AST 15 - 41 U/L '19 16 17  '$ ALT 0 - 44 U/L 49(H) 37 34  Alk Phosphatase 38 - 126 U/L 84 80 86  Total Bilirubin 0.3 - 1.2 mg/dL  0.7 1.1 0.9  Bilirubin, Direct 0.0 - 0.2 mg/dL - - -     No results found for: BNP    2.  Acute decompensation of chronic hemolytic anemia causing hemolytic blood loss requiring 4 units of packed RBC transfusion this admission, she received 1 unit on 01/05/2019 which was her last transfusion, currently no signs of ongoing hemolysis, H&H stable, platelets stable, continue steroids.  Continue monitoring haptoglobin, LDH, bilirubin and H&H are all stable for now.  Post discharge will follow with [facility].  Son requested to arrange for outpatient follow-up within a week.  Repeat H&H morning of 01/08/2019 and if no signs of ongoing hemolysis she will be discharged home.  Will give her 10-day of oral steroid taper.  3.  Elevated total bilirubin causing jaundice.  Due to hemolysis.  Currently resolved with supportive care.  4.  Mild thrombocytopenia.  Likely due to viral infection, HIT panel  -ve , counts are now rising no signs of bleeding for now.  Previous MD discussed the case with local hematologist Dr. Irene Limbo who recommended IVIG if platelet counts drop below 35,000.  5.  Hypotension and hyponatremia.  Resolved after packed RBC transfusion and IV fluids.   6.  DM type II.  A1c 6.4.  Currently on sliding scale.  CBG (last 3)  Recent Labs    01/06/19 1652 01/06/19 2053 01/07/19 0814  GLUCAP 320* 367* 83      Condition - Extremely Guarded  Family Communication  : Updated by me personally.  Code Status :  Full  Diet :   Diet Order            Diet regular Room service appropriate? Yes; Fluid consistency: Thin  Diet effective now               Disposition Plan  : Home 1-2 days  Consults  : Previous MD consulted Dr. Irene Limbo hematology.  Procedures  :    CT Abd - non acute    PUD Prophylaxis :  PPI  DVT Prophylaxis  :   SCDs    Lab Results  Component Value Date   PLT 142 (L) 01/07/2019    Inpatient Medications  Scheduled Meds: . sodium chloride   Intravenous Once  . sodium chloride   Intravenous Once  . atorvastatin  20 mg Oral Daily  . B-complex with vitamin C  1 tablet Oral Daily  . Chlorhexidine Gluconate Cloth  6 each Topical Daily  . dexamethasone  6 mg Oral Q breakfast  . folic acid  5 mg Oral Daily  . fondaparinux (ARIXTRA) injection  2.5 mg Subcutaneous Daily  . insulin aspart  0-20 Units Subcutaneous TID WC  . insulin aspart  0-5 Units Subcutaneous QHS  . insulin aspart  12 Units Subcutaneous TID WC  . insulin glargine  50 Units Subcutaneous BID  . pantoprazole  40 mg Oral Daily  . sodium chloride flush  3 mL Intravenous Q12H   Continuous Infusions: . sodium chloride    . sodium chloride     PRN Meds:.sodium chloride, acetaminophen **OR** acetaminophen, ondansetron (ZOFRAN) IV, polyethylene glycol, sodium chloride flush, traMADol, traZODone  Antibiotics  :    Anti-infectives (From admission, onward)   None       Time Spent in minutes  30   Lala Lund M.D on 01/07/2019 at 9:06 AM  To page go to [email or web address] - password [facility]  [facility] -  Office  [phone]    See all  Orders from today for further details    Objective:   Vitals:   01/07/19 0429 01/07/19 0431 01/07/19 0442 01/07/19 0813  BP: (!) 83/61 (!) '83/61 99/60 99/66 '$  Pulse: 64 (!) 52 66 66  Resp:  (!) '22 17 16  '$ Temp: 98.2 F (36.8 C)   98.5 F (36.9 C)  TempSrc: Oral     SpO2:  100% 100% 100%  Weight:      Height:        Wt Readings from Last 3 Encounters:  12/29/18 68 kg  01/10/16 62 kg  12/22/15 61.5 kg     Intake/Output Summary (Last 24 hours) at 01/07/2019 0906 Last data filed at 01/06/2019 2141 Gross per 24 hour  Intake 843 ml  Output -  Net 843 ml     Physical Exam  Awake Alert,   No new F.N deficits, Normal affect  Everetts.AT,PERRAL Supple Neck,No JVD, No cervical lymphadenopathy appriciated.  Symmetrical Chest wall movement, Good air movement bilaterally, CTAB RRR,No Gallops, Rubs or new Murmurs, No Parasternal Heave +ve B.Sounds, Abd Soft, No tenderness, No organomegaly appriciated, No rebound - guarding or rigidity. No Cyanosis, Clubbing or edema, No new Rash or bruise     Data Review:    CBC Recent Labs  Lab 01/04/19 0420 01/05/19 0320 01/05/19 2155 01/06/19 0430 01/07/19 0300  WBC 8.8 6.1 7.6 6.8 8.3  HGB 8.2* 7.0* 9.3* 8.7* 8.6*  HCT 23.4* 20.9* 26.8* 25.2* 24.7*  PLT 68* 67* 88* 93* 142*  MCV 96.3 97.2 94.0 93.0 93.9  MCH 33.7 32.6 32.6 32.1 32.7  MCHC 35.0 33.5 34.7 34.5 34.8  RDW 14.8 14.6 13.8 14.3 14.1    Chemistries  Recent Labs  Lab 01/03/19 1600 01/04/19 0420 01/05/19 0320 01/06/19 0430 01/07/19 0300  NA 132* 134* 136 137 138  K 4.7 4.2 3.8 3.7 3.8  CL 100 103 105 105 104  CO2 '22 22 24 25 25  '$ GLUCOSE 316* 228* 174* 128* 76  BUN 22* 22* 21* 19 19  CREATININE 0.86 0.58 0.51 0.50 0.52  CALCIUM 8.1* 8.3* 8.2* 8.3* 8.5*  AST  --   --  '17 16 19  '$ ALT  --   --  34 37 49*  ALKPHOS  --   --  86 80 84  BILITOT  --   --  0.9 1.1 0.7   ------------------------------------------------------------------------------------------------------------------ No results for input(s): CHOL, HDL, LDLCALC, TRIG, CHOLHDL, LDLDIRECT in the last 72 hours.  Lab Results  Component Value Date   HGBA1C 6.4 (H) 12/29/2018   ------------------------------------------------------------------------------------------------------------------ No results for input(s): TSH, T4TOTAL, T3FREE, THYROIDAB in the last 72 hours.  Invalid input(s): FREET3  Cardiac Enzymes No results for input(s): CKMB, TROPONINI, MYOGLOBIN in the last 168 hours.  Invalid input(s): CK ------------------------------------------------------------------------------------------------------------------ No results found for:  BNP  Micro Results Recent Results (from the past 240 hour(s))  SARS Coronavirus 2 (CEPHEID - Performed in [place] hospital lab), Hosp Order     Status: Abnormal   Collection Time: 12/29/18  3:22 PM   Specimen: Nasopharyngeal Swab  Result Value Ref Range Status   SARS Coronavirus 2 POSITIVE (A) NEGATIVE Final    Comment: RESULT CALLED TO, READ BACK BY AND VERIFIED WITH: WESTON,L ON 12/29/18 AT 1855 BY LOY,C (NOTE) If result is NEGATIVE SARS-CoV-2 target nucleic acids are NOT DETECTED. The SARS-CoV-2 RNA is generally detectable in upper and lower  respiratory specimens during the acute phase of infection. The lowest  concentration of SARS-CoV-2 viral copies this assay  can detect is 250  copies / mL. A negative result does not preclude SARS-CoV-2 infection  and should not be used as the sole basis for treatment or other  patient management decisions.  A negative result may occur with  improper specimen collection / handling, submission of specimen other  than nasopharyngeal swab, presence of viral mutation(s) within the  areas targeted by this assay, and inadequate number of viral copies  (<250 copies / mL). A negative result must be combined with clinical  observations, patient history, and epidemiological information. If result is POSITIVE SARS-CoV-2 target nucleic acids are DETECTED.  The SARS-CoV-2 RNA is generally detectable in upper and lower  respiratory specimens during the acute phase of infection.  Positive  results are indicative of active infection with SARS-CoV-2.  Clinical  correlation with patient history and other diagnostic information is  necessary to determine patient infection status.  Positive results do  not rule out bacterial infection or co-infection with other viruses. If result is PRESUMPTIVE POSTIVE SARS-CoV-2 nucleic acids MAY BE PRESENT.   A presumptive positive result was obtained on the submitted specimen  and confirmed on repeat testing.  While  2019 novel coronavirus  (SARS-CoV-2) nucleic acids may be present in the submitted sample  additional confirmatory testing may be necessary for epidemiological  and / or clinical management purposes  to differentiate between  SARS-CoV-2 and other Sarbecovirus currently known to infect humans.  If clinically indicated additional testing with an alternate test  methodology [id number]) i s advised. The SARS-CoV-2 RNA is generally  detectable in upper and lower respiratory specimens during the acute  phase of infection. The expected result is Negative. Fact Sheet for Patients:  StrictlyIdeas.no Fact Sheet for Healthcare Providers: BankingDealers.co.za This test is not yet approved or cleared by the [place] FDA and has been authorized for detection and/or diagnosis of SARS-CoV-2 by FDA under an Emergency Use Authorization (EUA).  This EUA will remain in effect (meaning this test can be used) for the duration of the COVID-19 declaration under Section 564(b)(1) of the Act, 21 U.S.C. section 360bbb-3(b)(1), unless the authorization is terminated or revoked sooner. Performed at [facility], [address] [place], [place] [address]   MRSA PCR Screening     Status: None   Collection Time: 12/30/18  6:53 AM   Specimen: Nasal Mucosa; Nasopharyngeal  Result Value Ref Range Status   MRSA by PCR NEGATIVE NEGATIVE Final    Comment:        The GeneXpert MRSA Assay (FDA approved for NASAL specimens only), is one component of a comprehensive MRSA colonization surveillance program. It is not intended to diagnose MRSA infection nor to guide or monitor treatment for MRSA infections. Performed at [facility], [place] [address], [place], [place] [address]     Radiology Reports Ct Abdomen Pelvis W Contrast  Result Date: 12/29/2018 CLINICAL DATA:  Pt has been nauseous for the last 4 days. Has thrown up 3-4 times in the last 24  hours. Is having abdominal pain and is jaundiced. No history of liver disease. EXAM: CT ABDOMEN AND PELVIS WITH CONTRAST TECHNIQUE: Multidetector CT imaging of the abdomen and pelvis was performed using the standard protocol following bolus administration of intravenous contrast. CONTRAST:  177m OMNIPAQUE IOHEXOL 300 MG/ML  SOLN COMPARISON:  None. FINDINGS: Lower chest: No acute abnormality. Hepatobiliary: No focal liver abnormality is seen. No gallstones, gallbladder wall thickening, or biliary dilatation. Pancreas: Unremarkable. No pancreatic ductal dilatation or surrounding inflammatory changes. Spleen: Normal  in size without focal abnormality. Adrenals/Urinary Tract: Adrenal glands are unremarkable. Kidneys are normal, without renal calculi, focal lesion, or hydronephrosis. Bladder is unremarkable. Stomach/Bowel: Stomach is within normal limits. Appendix appears normal. No evidence of bowel wall thickening, distention, or inflammatory changes. Vascular/Lymphatic: No significant vascular findings are present. No enlarged abdominal or pelvic lymph nodes. Reproductive: Uterus and bilateral adnexa are unremarkable. Other: No abdominal wall hernia or abnormality. No abdominopelvic ascites. Musculoskeletal: No acute osseous abnormality. No aggressive osseous lesion. Bilateral facet arthropathy at L5-S1. IMPRESSION: 1. No acute abdominal or pelvic pathology. 2. Normal appendix. Electronically Signed   By: Kathreen Devoid   On: 12/29/2018 16:47   Dg Chest Port 1 View  Result Date: 01/03/2019 CLINICAL DATA:  Cough EXAM: PORTABLE CHEST 1 VIEW COMPARISON:  12/29/2018 FINDINGS: Heart size is normal. Mediastinal shadows are normal. There are patchy infiltrates in both lower lungs, left more than right, suggesting pneumonia. This could be viral or bacterial. Upper lungs are clear. No visible effusion. No bone abnormality. IMPRESSION: Patchy infiltrates in both lower lobes left more than right that could be viral pneumonia.  Electronically Signed   By: Nelson Chimes M.D.   On: 01/03/2019 11:33   Dg Chest Portable 1 View  Result Date: 12/29/2018 CLINICAL DATA:  Nausea and emesis. Shortness of breath and fever. EXAM: PORTABLE CHEST 1 VIEW COMPARISON:  December 29, 2018 FINDINGS: Cardiomediastinal silhouette is normal. Mediastinal contours appear intact. Low lung volumes. Linear opacity in the left lower lung field. Osseous structures are without acute abnormality. Soft tissues are grossly normal. IMPRESSION: 1. Low lung volumes. 2. Linear opacity in the left lower lung field may represent atelectasis or airspace consolidation. Electronically Signed   By: Fidela Salisbury M.D.   On: 12/29/2018 18:41

## 2019-01-07 NOTE — Progress Notes (Signed)
Offered to update family members, patient confirmed she will keep her family updated and does not want [place] to call them at this time.

## 2019-01-08 LAB — COMPREHENSIVE METABOLIC PANEL
ALT: 51 U/L — ABNORMAL HIGH (ref 0–44)
AST: 26 U/L (ref 15–41)
Albumin: 3.2 g/dL — ABNORMAL LOW (ref 3.5–5.0)
Alkaline Phosphatase: 92 U/L (ref 38–126)
Anion gap: 10 (ref 5–15)
BUN: 18 mg/dL (ref 6–20)
CO2: 23 mmol/L (ref 22–32)
Calcium: 8.4 mg/dL — ABNORMAL LOW (ref 8.9–10.3)
Chloride: 103 mmol/L (ref 98–111)
Creatinine, Ser: 0.58 mg/dL (ref 0.44–1.00)
GFR calc Af Amer: >60 mL/min (ref >60)
GFR calc non Af Amer: >60 mL/min (ref >60)
Glucose, Bld: 98 mg/dL (ref 70–99)
Potassium: 4.1 mmol/L (ref 3.5–5.1)
Sodium: 136 mmol/L (ref 135–145)
Total Bilirubin: 0.7 mg/dL (ref 0.3–1.2)
Total Protein: 5.8 g/dL — ABNORMAL LOW (ref 6.5–8.1)

## 2019-01-08 LAB — CBC
HCT: 26.2 % — ABNORMAL LOW (ref 36.0–46.0)
Hemoglobin: 8.8 g/dL — ABNORMAL LOW (ref 12.0–15.0)
MCH: 32.4 pg (ref 26.0–34.0)
MCHC: 33.6 g/dL (ref 30.0–36.0)
MCV: 96.3 fL (ref 80.0–100.0)
Platelets: UNDETERMINED 10*3/uL (ref 150–400)
RBC: 2.72 MIL/uL — ABNORMAL LOW (ref 3.87–5.11)
RDW: 13.8 % (ref 11.5–15.5)
WBC: 10.5 10*3/uL (ref 4.0–10.5)
nRBC: 0.3 % — ABNORMAL HIGH (ref 0.0–0.2)

## 2019-01-08 LAB — GLUCOSE, CAPILLARY
Glucose-Capillary: 73 mg/dL (ref 70–99)
Glucose-Capillary: 229 mg/dL — ABNORMAL HIGH (ref 70–99)
Glucose-Capillary: 319 mg/dL — ABNORMAL HIGH (ref 70–99)
Glucose-Capillary: 348 mg/dL — ABNORMAL HIGH (ref 70–99)

## 2019-01-08 LAB — HAPTOGLOBIN: Haptoglobin: 32 mg/dL — ABNORMAL LOW (ref 33–346)

## 2019-01-08 MED ORDER — PREDNISONE 5 MG PO TABS: ORAL_TABLET | ORAL | 0 refills | Status: DC

## 2019-01-08 MED ORDER — "INSULIN SYRINGE-NEEDLE U-100 25G X 1"" 1 ML MISC": 0 refills | Status: AC

## 2019-01-08 MED ORDER — INSULIN ASPART 100 UNIT/ML ~~LOC~~ SOLN: SUBCUTANEOUS | 0 refills | Status: AC

## 2019-01-08 MED ORDER — LIVING WELL WITH DIABETES BOOK - IN SPANISH
Freq: Once | Status: AC
  Administered 2019-01-08: 13:00:00
  Filled 2019-01-08: qty 1

## 2019-01-08 MED ORDER — INSULIN STARTER KIT- SYRINGES (SPANISH)
1.0000 | Freq: Once | Status: AC
  Administered 2019-01-08: 1
  Filled 2019-01-08: qty 1

## 2019-01-08 MED ORDER — BLOOD GLUCOSE MONITOR KIT: PACK | 1 refills | Status: AC

## 2019-01-08 NOTE — Discharge Instructions (Signed)
Follow with Primary MD Tula NakayamaKaram, Phillip Jerome, MD in 7 days   Get CBC, CMP, 2 view Chest X ray -  checked next visit within 1 week by Primary MD   Activity: As tolerated with Full fall precautions use walker/cane & assistance as needed  Disposition Home   Diet: Heart Healthy Low Carb  Accuchecks 4 times/day, Once in AM empty stomach and then before each meal. Log in all results and show them to your Prim.MD in 3 days. If any glucose reading is under 80 or above 300 call your Prim MD immidiately. Follow Low glucose instructions for glucose under 80 as instructed.   Special Instructions: If you have smoked or chewed Tobacco  in the last 2 yrs please stop smoking, stop any regular Alcohol  and or any Recreational drug use.  On your next visit with your primary care physician please Get Medicines reviewed and adjusted.  Please request your Prim.MD to go over all Hospital Tests and Procedure/Radiological results at the follow up, please get all Hospital records sent to your Prim MD by signing hospital release before you go home.  If you experience worsening of your admission symptoms, develop shortness of breath, life threatening emergency, suicidal or homicidal thoughts you must seek medical attention immediately by calling 911 or calling your MD immediately  if symptoms less severe.  You Must read complete instructions/literature along with all the possible adverse reactions/side effects for all the Medicines you take and that have been prescribed to you. Take any new Medicines after you have completely understood and accpet all the possible adverse reactions/side effects.        Person Under Monitoring Name: Hannah Dickson  Location: [address] [place] [id number]   Infection Prevention Recommendations for Individuals Confirmed to have, or Being Evaluated for, 2019 Novel Coronavirus (COVID-19) Infection Who Receive Care at Home  Individuals who are  confirmed to have, or are being evaluated for, COVID-19 should follow the prevention steps below until a healthcare provider or local or state health department says they can return to normal activities.  Stay home except to get medical care You should restrict activities outside your home, except for getting medical care. Do not go to work, school, or public areas, and do not use public transportation or taxis.  Call ahead before visiting your doctor Before your medical appointment, call the healthcare provider and tell them that you have, or are being evaluated for, COVID-19 infection. This will help the healthcare providers office take steps to keep other people from getting infected. Ask your healthcare provider to call the local or state health department.  Monitor your symptoms Seek prompt medical attention if your illness is worsening (e.g., difficulty breathing). Before going to your medical appointment, call the healthcare provider and tell them that you have, or are being evaluated for, COVID-19 infection. Ask your healthcare provider to call the local or state health department.  Wear a facemask You should wear a facemask that covers your nose and mouth when you are in the same room with other people and when you visit a healthcare provider. People who live with or visit you should also wear a facemask while they are in the same room with you.  Separate yourself from other people in your home As much as possible, you should stay in a different room from other people in your home. Also, you should use a separate bathroom, if available.  Avoid sharing household items You should not share dishes,  drinking glasses, cups, eating utensils, towels, bedding, or other items with other people in your home. After using these items, you should wash them thoroughly with soap and water.  Cover your coughs and sneezes Cover your mouth and nose with a tissue when you cough or sneeze, or  you can cough or sneeze into your sleeve. Throw used tissues in a lined trash can, and immediately wash your hands with soap and water for at least 20 seconds or use an alcohol-based hand rub.  Wash your [facility] your hands often and thoroughly with soap and water for at least 20 seconds. You can use an alcohol-based hand sanitizer if soap and water are not available and if your hands are not visibly dirty. Avoid touching your eyes, nose, and mouth with unwashed hands.   Prevention Steps for Caregivers and Household Members of Individuals Confirmed to have, or Being Evaluated for, COVID-19 Infection Being Cared for in the Home  If you live with, or provide care at home for, a person confirmed to have, or being evaluated for, COVID-19 infection please follow these guidelines to prevent infection:  Follow healthcare providers instructions Make sure that you understand and can help the patient follow any healthcare provider instructions for all care.  Provide for the patients basic needs You should help the patient with basic needs in the home and provide support for getting groceries, prescriptions, and other personal needs.  Monitor the patients symptoms If they are getting sicker, call his or her medical provider and tell them that the patient has, or is being evaluated for, COVID-19 infection. This will help the healthcare providers office take steps to keep other people from getting infected. Ask the healthcare provider to call the local or state health department.  Limit the number of people who have contact with the patient  If possible, have only one caregiver for the patient.  Other household members should stay in another home or place of residence. If this is not possible, they should stay  in another room, or be separated from the patient as much as possible. Use a separate bathroom, if available.  Restrict visitors who do not have an essential need to be in the  home.  Keep older adults, very young children, and other sick people away from the patient Keep older adults, very young children, and those who have compromised immune systems or chronic health conditions away from the patient. This includes people with chronic heart, lung, or kidney conditions, diabetes, and cancer.  Ensure good ventilation Make sure that shared spaces in the home have good air flow, such as from an air conditioner or an opened window, weather permitting.  Wash your hands often  Wash your hands often and thoroughly with soap and water for at least 20 seconds. You can use an alcohol based hand sanitizer if soap and water are not available and if your hands are not visibly dirty.  Avoid touching your eyes, nose, and mouth with unwashed hands.  Use disposable paper towels to dry your hands. If not available, use dedicated cloth towels and replace them when they become wet.  Wear a facemask and gloves  Wear a disposable facemask at all times in the room and gloves when you touch or have contact with the patients blood, body fluids, and/or secretions or excretions, such as sweat, saliva, sputum, nasal mucus, vomit, urine, or feces.  Ensure the mask fits over your nose and mouth tightly, and do not touch it during use.  Throw out disposable facemasks and gloves after using them. Do not reuse.  Wash your hands immediately after removing your facemask and gloves.  If your personal clothing becomes contaminated, carefully remove clothing and launder. Wash your hands after handling contaminated clothing.  Place all used disposable facemasks, gloves, and other waste in a lined container before disposing them with other household waste.  Remove gloves and wash your hands immediately after handling these items.  Do not share dishes, glasses, or other household items with the patient  Avoid sharing household items. You should not share dishes, drinking glasses, cups, eating  utensils, towels, bedding, or other items with a patient who is confirmed to have, or being evaluated for, COVID-19 infection.  After the person uses these items, you should wash them thoroughly with soap and water.  Wash laundry thoroughly  Immediately remove and wash clothes or bedding that have blood, body fluids, and/or secretions or excretions, such as sweat, saliva, sputum, nasal mucus, vomit, urine, or feces, on them.  Wear gloves when handling laundry from the patient.  Read and follow directions on labels of laundry or clothing items and detergent. In general, wash and dry with the warmest temperatures recommended on the label.  Clean all areas the individual has used often  Clean all touchable surfaces, such as counters, tabletops, doorknobs, bathroom fixtures, toilets, phones, keyboards, tablets, and bedside tables, every day. Also, clean any surfaces that may have blood, body fluids, and/or secretions or excretions on them.  Wear gloves when cleaning surfaces the patient has come in contact with.  Use a diluted bleach solution (e.g., dilute bleach with 1 part bleach and 10 parts water) or a household disinfectant with a label that says EPA-registered for coronaviruses. To make a bleach solution at home, add 1 tablespoon of bleach to 1 quart (4 cups) of water. For a larger supply, add  cup of bleach to 1 gallon (16 cups) of water.  Read labels of cleaning products and follow recommendations provided on product labels. Labels contain instructions for safe and effective use of the cleaning product including precautions you should take when applying the product, such as wearing gloves or eye protection and making sure you have good ventilation during use of the product.  Remove gloves and wash hands immediately after cleaning.  Monitor yourself for signs and symptoms of illness Caregivers and household members are considered close contacts, should monitor their health, and will be  asked to limit movement outside of the home to the extent possible. Follow the monitoring steps for close contacts listed on the symptom monitoring form.   ? If you have additional questions, contact your local health department or call the epidemiologist on call at [phone] (available 24/7). ? This guidance is subject to change. For the most up-to-date guidance from [facility], please refer to their website: TripMetro.huhttps://www.cdc.gov/coronavirus/2019-ncov/hcp/guidance-prevent-spread.html

## 2019-01-08 NOTE — Progress Notes (Signed)
Inpatient Diabetes Program Recommendations  AACE/ADA: New Consensus Statement on Inpatient Glycemic Control (2015)  Target Ranges:  Prepandial:   less than 140 mg/dL      Peak postprandial:   less than 180 mg/dL (1-2 hours)      Critically ill patients:  140 - 180 mg/dL   Lab Results  Component Value Date   GLUCAP 348 (H) 01/08/2019   HGBA1C 6.4 (H) 12/29/2018    Review of Glycemic Control  Diabetes history: DM2 Outpatient Diabetes medications: Amaryl 4 mg QAM, metformin 1000 mg bid Current orders for Inpatient glycemic control: Lantus 50 units bid, Novolog 12 units tidwc + 0-20 units tidwc and 0-5 units QHS  Pt to be discharged home on insulin. Has glucose meter at home. Case manager provided [facility] assistance for meds.   Diabetes and insulin education provided by RN. Per RN, she did well with learning the sliding scale, drawing up insulin, and giving it to herself. She was educated on hypoglycemia s/s and treatment. Was given Living Well with Diabetes and Insulin Starter Kit, both in [place].     Inpatient Diabetes Program Recommendations:     Discharged on Novolog 2-10 units tidwc, metformin 1000 mg bid, Amaryl 4 mg QD.  To f/u at [facility] on 01/20/19'@10'$ :30 am.  Thank you. Lorenda Peck, RD, LDN, CDE Inpatient Diabetes Coordinator [phone]

## 2019-01-08 NOTE — Progress Notes (Signed)
                                      [facility]                            [place], [place] [address]      Hannah Dickson was admitted to the Hospital on 12/29/2018 and Discharged  01/08/2019 and should be excused from work/school   for 14 days starting from date -  12/29/2018 , may return to work/school with a facemask for another 2 weeks.  Also to see her primary care physician prior to returning to work.  Call Lala Lund MD, [facility]  [phone] with questions.  Lala Lund M.D on 01/08/2019,at 4:31 PM  [facility]   Office  [phone]

## 2019-01-08 NOTE — Progress Notes (Signed)
Patient discharged to home. AVS was reviewed and all questions answered. Detailed instructions for insulin administration were provided. Patient was able to demonstrate how to use sliding scale, draw up insulin and administer it. Discussed rotating sites, bedtime snacks, and hypoglycemia. Provided booklet and starter kit given.Patient has glucometer at home. Reviewed quarantine guidelines per the public health department, and contract was signed and placed in the chart. IV's removed. Prescriptions and match letter provided. Patient was assisted to the exit via wheelchair and her son provided transportation

## 2019-01-08 NOTE — TOC Transition Note (Signed)
Transition of Care [facility]) - CM/SW Discharge Note   Patient Details  Name: Hannah Dickson MRN: [id number] Date of Birth: 11/21/1965  [facility] Care [facility]) CM/SW Contact:  Ninfa Meeker, RN Phone Number: [phone] (working remotely ) 01/08/2019, 12:32 PM   Clinical Narrative:  53 yr old female admitted and treated for COVID 19. Than kfully patient is improving and able to discharge home. Case Manager provided  [facility] Program assistance. Letter was emailed to bedside RN for patient to have family take to her Suzie Portela, MD sent her prescriptions there earlier. F/U telephonic appointment was scheduled by previous case manager and is on AVS.     Final next level of care: Home/Self Care Barriers to Discharge: No Barriers Identified   Patient Goals and CMS Choice     Choice offered to / list presented to : NA  Discharge Placement                       Discharge Plan and Services   Discharge Planning Services: [facility] Program Post Acute Care Choice: NA          DME Arranged: N/A         HH Arranged: NA          Social Determinants of Health (SDOH) Interventions     Readmission Risk Interventions No flowsheet data found.

## 2019-01-08 NOTE — Discharge Summary (Signed)
Hannah Dickson FHS:[id number] DOB: June 05, 1966 DOA: 12/29/2018  PCP: [name], MD  Admit date: 12/29/2018  Discharge date: 01/08/2019  Admitted From: Home   Disposition:  Home   Recommendations for Outpatient Follow-up:   Follow up with PCP in 1-2 weeks  PCP Please obtain BMP/CBC, 2 view CXR in 1week,  (see Discharge instructions)   PCP Please follow up on the following pending results:    Home Health: None   Equipment/Devices: None  Consultations: None  Discharge Condition: Stable    CODE STATUS: Full    Diet Recommendation: Heart Healthy Low Carb    Chief Complaint  Patient presents with  . Nausea     Brief history of present illness from the day of admission and additional interim summary    Hannah Dickson an 53 y.o.femaleSpanish-speaking only with past medical history of autoimmune hemolytic anemia, diabetes mellitus and hypertension who presents to the ED after 3 to 4 days of abdominal pain nausea and vomiting.  She had exposure to COVID positive husband for a little while, in the ER she was found to have a hemoglobin of 3.2 and an elevated bilirubin of 6.5.  Her work-up was suggestive of COVID-19 infection along with acute decompensation of her chronic hemolytic anemia and she was admitted.                                                                 Hospital Course    1.  Acute Covid 19 Viral Pneumonitis during the ongoing 2020 Covid 19 Pandemic - no hypoxia or oxygen demand, stable inflammatory markers, she remained symptom-free on room air and will be discharged on short course of steroid taper orally.  COVID-19 Labs  Recent Labs    01/06/19 0430 01/07/19 0300  FERRITIN 662* 676*  LDH 250* 250*  CRP 2.4* 1.3*    Lab Results  Component Value Date    SARSCOV2NAA POSITIVE (A) 12/29/2018     2.  Acute decompensation of chronic hemolytic anemia causing hemolytic blood loss requiring 4 units of packed RBC transfusion this admission, she received 1 unit on 01/05/2019 which was her last transfusion, currently no signs of ongoing hemolysis, H&H stable, platelets stable, continue steroids. Post discharge will follow with [facility].  Son requested to arrange for outpatient follow-up within a week.  Repeat H&H remained stable and gradually trending up, no signs of ongoing hemolysis with stable LDH, haptoglobin and total bilirubin, will be discharged on short course of oral short acting insulin 3 times daily with with outpatient hematology and PCP follow-up.  3.  Elevated total bilirubin causing jaundice.  Due to hemolysis.  Currently resolved with supportive care.  4.  Mild thrombocytopenia.  Likely due to viral infection, HIT panel  -ve , counts are now rising no signs  of bleeding for now.     5.  Hypotension and hyponatremia.  Resolved after packed RBC transfusion and IV fluids.   6.  DM type II.  A1c 6.4.    Comments home regimen since she is being discharged on a steroid taper hence she has been provided NovoLog sliding scale with written instructions and testing supplies.  Diabetic and insulin education will be provided prior to discharge.   Discharge diagnosis     Principal Problem:   Acute hemolytic anemia (HCC) Active Problems:   Type 2 diabetes mellitus, uncontrolled (HCC)   Warm reactive antibody (HCC)   Hypotension   Hyperbilirubinemia   Acute metabolic encephalopathy    Discharge instructions    Discharge Instructions    Diet - low sodium heart healthy   Complete by: As directed    Discharge instructions   Complete by: As directed    Follow with Primary MD [name], MD in 7 days   Get CBC, CMP, 2 view Chest X ray -  checked next visit within 1 week by Primary MD   Activity: As tolerated with  Full fall precautions use walker/cane & assistance as needed  Disposition Home   Diet: Heart Healthy Low Carb  Accuchecks 4 times/day, Once in AM empty stomach and then before each meal. Log in all results and show them to your Prim.MD in 3 days. If any glucose reading is under 80 or above 300 call your Prim MD immidiately. Follow Low glucose instructions for glucose under 80 as instructed.   Special Instructions: If you have smoked or chewed Tobacco  in the last 2 yrs please stop smoking, stop any regular Alcohol  and or any Recreational drug use.  On your next visit with your primary care physician please Get Medicines reviewed and adjusted.  Please request your Prim.MD to go over all Hospital Tests and Procedure/Radiological results at the follow up, please get all Hospital records sent to your Prim MD by signing hospital release before you go home.  If you experience worsening of your admission symptoms, develop shortness of breath, life threatening emergency, suicidal or homicidal thoughts you must seek medical attention immediately by calling 911 or calling your MD immediately  if symptoms less severe.  You Must read complete instructions/literature along with all the possible adverse reactions/side effects for all the Medicines you take and that have been prescribed to you. Take any new Medicines after you have completely understood and accpet all the possible adverse reactions/side effects.   Increase activity slowly   Complete by: As directed       Discharge Medications   Allergies as of 01/08/2019   No Known Allergies     Medication List    TAKE these medications   acetaminophen 325 MG tablet Commonly known as: TYLENOL Take 2 tablets (650 mg total) by mouth every 6 (six) hours as needed for mild pain (or Fever >/= 101).   atorvastatin 20 MG tablet Commonly known as: LIPITOR Take 1 tablet by mouth daily.   blood glucose meter kit and supplies Kit Dispense based on  patient and insurance preference. Use up to four times daily as directed. (FOR ICD-9 250.00, 250.01). For QAC - HS accuchecks.   folic acid 1 MG tablet Commonly known as: FOLVITE Take 1 tablet by mouth daily.   glimepiride 4 MG tablet Commonly known as: AMARYL Take 1 tablet by mouth daily.   insulin aspart 100 UNIT/ML injection Commonly known as: NovoLOG Before each meal  3 times a day , 140-199 - 2 units, 200-250 - 6 units, 251-299 - 8 units,  300-349 - 10 units,  350 or above 14 units. Insulin PEN if approved, provide syringes and needles if needed. Can switch to any other cheaper alternative.   Insulin Syringe-Needle U-100 25G X 1" 1 ML Misc For 4 times a day insulin SQ, 1 month supply. Diagnosis E11.65   lisinopril 2.5 MG tablet Commonly known as: ZESTRIL Take 1 tablet by mouth daily.   metFORMIN 1000 MG tablet Commonly known as: GLUCOPHAGE Take 1 tablet (1,000 mg total) by mouth 2 (two) times daily with a meal.   predniSONE 5 MG tablet Commonly known as: DELTASONE Label  & dispense according to the schedule below. take 8 Pills PO for 3 days, 6 Pills PO for 3 days, 4 Pills PO for 3 days, 2 Pills PO for 3 days, 1 Pills PO for 3 days, 1/2 Pill  PO for 3 days then STOP. Total 65 pills.   Thera Tabs Take 1 tablet by mouth daily.       Follow-up [facility] Follow up.   Why: You are scheduled for a telephone hospital follow up call on Wednesday, January 20, 2019 at 10:30 am with Dr. Asencion Noble.  Please be available for the call. Contact information: [address] [place] [address] [phone]       Your hematologist at [facility] within a week. Schedule an appointment as soon as possible for a visit in 1 week(s).           Major procedures and Radiology Reports - PLEASE review detailed and final reports thoroughly  -         Ct Abdomen Pelvis W Contrast  Result Date: 12/29/2018  CLINICAL DATA:  Pt has been nauseous for the last 4 days. Has thrown up 3-4 times in the last 24 hours. Is having abdominal pain and is jaundiced. No history of liver disease. EXAM: CT ABDOMEN AND PELVIS WITH CONTRAST TECHNIQUE: Multidetector CT imaging of the abdomen and pelvis was performed using the standard protocol following bolus administration of intravenous contrast. CONTRAST:  143m OMNIPAQUE IOHEXOL 300 MG/ML  SOLN COMPARISON:  None. FINDINGS: Lower chest: No acute abnormality. Hepatobiliary: No focal liver abnormality is seen. No gallstones, gallbladder wall thickening, or biliary dilatation. Pancreas: Unremarkable. No pancreatic ductal dilatation or surrounding inflammatory changes. Spleen: Normal in size without focal abnormality. Adrenals/Urinary Tract: Adrenal glands are unremarkable. Kidneys are normal, without renal calculi, focal lesion, or hydronephrosis. Bladder is unremarkable. Stomach/Bowel: Stomach is within normal limits. Appendix appears normal. No evidence of bowel wall thickening, distention, or inflammatory changes. Vascular/Lymphatic: No significant vascular findings are present. No enlarged abdominal or pelvic lymph nodes. Reproductive: Uterus and bilateral adnexa are unremarkable. Other: No abdominal wall hernia or abnormality. No abdominopelvic ascites. Musculoskeletal: No acute osseous abnormality. No aggressive osseous lesion. Bilateral facet arthropathy at L5-S1. IMPRESSION: 1. No acute abdominal or pelvic pathology. 2. Normal appendix. Electronically Signed   By: HKathreen Devoid  On: 12/29/2018 16:47   Dg Chest Port 1 View  Result Date: 01/03/2019 CLINICAL DATA:  Cough EXAM: PORTABLE CHEST 1 VIEW COMPARISON:  12/29/2018 FINDINGS: Heart size is normal. Mediastinal shadows are normal. There are patchy infiltrates in both lower lungs, left more than right, suggesting pneumonia. This could be viral or bacterial. Upper lungs are clear. No visible effusion. No bone abnormality.  IMPRESSION: Patchy infiltrates in  both lower lobes left more than right that could be viral pneumonia. Electronically Signed   By: Nelson Chimes M.D.   On: 01/03/2019 11:33   Dg Chest Portable 1 View  Result Date: 12/29/2018 CLINICAL DATA:  Nausea and emesis. Shortness of breath and fever. EXAM: PORTABLE CHEST 1 VIEW COMPARISON:  December 29, 2018 FINDINGS: Cardiomediastinal silhouette is normal. Mediastinal contours appear intact. Low lung volumes. Linear opacity in the left lower lung field. Osseous structures are without acute abnormality. Soft tissues are grossly normal. IMPRESSION: 1. Low lung volumes. 2. Linear opacity in the left lower lung field may represent atelectasis or airspace consolidation. Electronically Signed   By: Fidela Salisbury M.D.   On: 12/29/2018 18:41    Micro Results     Recent Results (from the past 240 hour(s))  SARS Coronavirus 2 (CEPHEID - Performed in [facility] hospital lab), Hosp Order     Status: Abnormal   Collection Time: 12/29/18  3:22 PM   Specimen: Nasopharyngeal Swab  Result Value Ref Range Status   SARS Coronavirus 2 POSITIVE (A) NEGATIVE Final    Comment: RESULT CALLED TO, READ BACK BY AND VERIFIED WITH: WESTON,L ON 12/29/18 AT 1855 BY LOY,C (NOTE) If result is NEGATIVE SARS-CoV-2 target nucleic acids are NOT DETECTED. The SARS-CoV-2 RNA is generally detectable in upper and lower  respiratory specimens during the acute phase of infection. The lowest  concentration of SARS-CoV-2 viral copies this assay can detect is 250  copies / mL. A negative result does not preclude SARS-CoV-2 infection  and should not be used as the sole basis for treatment or other  patient management decisions.  A negative result may occur with  improper specimen collection / handling, submission of specimen other  than nasopharyngeal swab, presence of viral mutation(s) within the  areas targeted by this assay, and inadequate number of viral copies  (<250 copies / mL). A  negative result must be combined with clinical  observations, patient history, and epidemiological information. If result is POSITIVE SARS-CoV-2 target nucleic acids are DETECTED.  The SARS-CoV-2 RNA is generally detectable in upper and lower  respiratory specimens during the acute phase of infection.  Positive  results are indicative of active infection with SARS-CoV-2.  Clinical  correlation with patient history and other diagnostic information is  necessary to determine patient infection status.  Positive results do  not rule out bacterial infection or co-infection with other viruses. If result is PRESUMPTIVE POSTIVE SARS-CoV-2 nucleic acids MAY BE PRESENT.   A presumptive positive result was obtained on the submitted specimen  and confirmed on repeat testing.  While 2019 novel coronavirus  (SARS-CoV-2) nucleic acids may be present in the submitted sample  additional confirmatory testing may be necessary for epidemiological  and / or clinical management purposes  to differentiate between  SARS-CoV-2 and other Sarbecovirus currently known to infect humans.  If clinically indicated additional testing with an alternate test  methodology [phone]) i s advised. The SARS-CoV-2 RNA is generally  detectable in upper and lower respiratory specimens during the acute  phase of infection. The expected result is Negative. Fact Sheet for Patients:  StrictlyIdeas.no Fact Sheet for Healthcare Providers: BankingDealers.co.za This test is not yet approved or cleared by the [place] FDA and has been authorized for detection and/or diagnosis of SARS-CoV-2 by FDA under an Emergency Use Authorization (EUA).  This EUA will remain in effect (meaning this test can be used) for the duration of the COVID-19 declaration under Section 564(b)(1) of  the Act, 21 U.S.C. section 360bbb-3(b)(1), unless the authorization is terminated or revoked sooner. Performed  at [facility], [address], [place], [place] [address]   MRSA PCR Screening     Status: None   Collection Time: 12/30/18  6:53 AM   Specimen: Nasal Mucosa; Nasopharyngeal  Result Value Ref Range Status   MRSA by PCR NEGATIVE NEGATIVE Final    Comment:        The GeneXpert MRSA Assay (FDA approved for NASAL specimens only), is one component of a comprehensive MRSA colonization surveillance program. It is not intended to diagnose MRSA infection nor to guide or monitor treatment for MRSA infections. Performed at [facility], [place] [address] [place], [place] [address]     Today   Subjective    Hannah Dickson today has no headache,no chest abdominal pain,no new weakness tingling or numbness, feels much better wants to go home today.     Objective   Blood pressure 105/65, pulse 72, temperature 98.7 F (37.1 C), temperature source Oral, resp. rate 19, height '4\' 10"'$  (1.473 m), weight 68 kg, SpO2 100 %.   Intake/Output Summary (Last 24 hours) at 01/08/2019 0910 Last data filed at 01/07/2019 2300 Gross per 24 hour  Intake 3 ml  Output -  Net 3 ml    Exam  Awake Alert,   No new F.N deficits, Normal affect [place].AT,PERRAL Supple Neck,No JVD, No cervical lymphadenopathy appriciated.  Symmetrical Chest wall movement, Good air movement bilaterally, CTAB RRR,No Gallops,Rubs or new Murmurs, No Parasternal Heave +ve B.Sounds, Abd Soft, Non tender, No organomegaly appriciated, No rebound -guarding or rigidity. No Cyanosis, Clubbing or edema, No new Rash or bruise   Data Review   CBC w Diff:  Lab Results  Component Value Date   WBC 10.5 01/08/2019   HGB 8.8 (L) 01/08/2019   HCT 26.2 (L) 01/08/2019   PLT PLATELET CLUMPS NOTED ON SMEAR, UNABLE TO ESTIMATE 01/08/2019   LYMPHOPCT 12 12/30/2018   MONOPCT 4 12/30/2018   EOSPCT 0 12/30/2018   BASOPCT 0 12/30/2018    CMP:  Lab Results  Component Value Date   NA 136 01/08/2019   K 4.1  01/08/2019   CL 103 01/08/2019   CO2 23 01/08/2019   BUN 18 01/08/2019   CREATININE 0.58 01/08/2019   PROT 5.8 (L) 01/08/2019   ALBUMIN 3.2 (L) 01/08/2019   BILITOT 0.7 01/08/2019   ALKPHOS 92 01/08/2019   AST 26 01/08/2019   ALT 51 (H) 01/08/2019  .  Lab Results  Component Value Date   HGBA1C 6.4 (H) 12/29/2018   CBG (last 3)  Recent Labs    01/07/19 0814 01/07/19 1635 01/08/19 0806  GLUCAP 83 311* 73    Total Time in preparing paper work, data evaluation and todays exam - 64 minutes  Lala Lund M.D on 01/08/2019 at 9:10 AM  [facility]   Office  [phone]

## 2019-01-09 LAB — HAPTOGLOBIN: Haptoglobin: 28 mg/dL — ABNORMAL LOW (ref 33–346)

## 2019-01-20 ENCOUNTER — Inpatient Hospital Stay: Payer: Self-pay | Admitting: Critical Care Medicine

## 2019-01-26 ENCOUNTER — Other Ambulatory Visit (HOSPITAL_COMMUNITY): Payer: Self-pay | Admitting: *Deleted

## 2019-01-26 DIAGNOSIS — Z1231 Encounter for screening mammogram for malignant neoplasm of breast: Secondary | ICD-10-CM

## 2019-02-08 ENCOUNTER — Other Ambulatory Visit: Payer: Self-pay

## 2019-02-08 ENCOUNTER — Ambulatory Visit (HOSPITAL_COMMUNITY)
Admission: RE | Admit: 2019-02-08 | Discharge: 2019-02-08 | Disposition: A | Discharge status: Home / Self Care | Payer: PRIVATE HEALTH INSURANCE | Source: Ambulatory Visit | Attending: *Deleted | Admitting: *Deleted

## 2019-02-08 DIAGNOSIS — Z1231 Encounter for screening mammogram for malignant neoplasm of breast: Secondary | ICD-10-CM | POA: Diagnosis not present

## 2019-02-10 ENCOUNTER — Ambulatory Visit: Payer: HRSA Program | Attending: Critical Care Medicine | Admitting: Critical Care Medicine

## 2019-02-10 ENCOUNTER — Other Ambulatory Visit: Payer: Self-pay

## 2019-02-10 ENCOUNTER — Encounter: Payer: Self-pay | Admitting: Critical Care Medicine

## 2019-02-10 DIAGNOSIS — E1165 Type 2 diabetes mellitus with hyperglycemia: Secondary | ICD-10-CM

## 2019-02-10 DIAGNOSIS — U071 COVID-19: Secondary | ICD-10-CM

## 2019-02-10 DIAGNOSIS — R740 Nonspecific elevation of levels of transaminase and lactic acid dehydrogenase [LDH]: Secondary | ICD-10-CM

## 2019-02-10 DIAGNOSIS — J1282 Pneumonia due to coronavirus disease 2019: Secondary | ICD-10-CM

## 2019-02-10 DIAGNOSIS — R7401 Elevation of levels of liver transaminase levels: Secondary | ICD-10-CM | POA: Insufficient documentation

## 2019-02-10 DIAGNOSIS — D709 Neutropenia, unspecified: Secondary | ICD-10-CM

## 2019-02-10 DIAGNOSIS — D591 Autoimmune hemolytic anemia, unspecified: Secondary | ICD-10-CM

## 2019-02-10 DIAGNOSIS — R768 Other specified abnormal immunological findings in serum: Secondary | ICD-10-CM

## 2019-02-10 DIAGNOSIS — J1289 Other viral pneumonia: Secondary | ICD-10-CM | POA: Diagnosis not present

## 2019-02-10 NOTE — Progress Notes (Signed)
Patient ID: Hannah Dickson, female   DOB: [birth year], 53 y.o.   MRN: [id number] Virtual Visit via Telephone Note  I connected with Hannah Dickson on 02/10/19 at 10:00 AM EDT by telephone and verified that I am speaking with the correct person using two identifiers.   Consent:  I discussed the limitations, risks, security and privacy concerns of performing an evaluation and management service by telephone and the availability of in person appointments. I also discussed with the patient that there may be a patient responsible charge related to this service. The patient expressed understanding and agreed to proceed.  Location of patient: The patient was at Hannah  Location of provider: I was in my office  Persons participating in the televisit with the patient.   Hannah Dickson of Spanish interpreter was on the call with the patient and no one else was on the call    History of Present Illness: This is a pleasant 53 year old female who was admitted for COVID-19 pneumonia between the 14th and 24 July.  Patient has a pre-existing history of autoimmune hemolytic anemia and this was exacerbated during this illness.  The patient had significant drop of hemoglobin down to level 3.  She had elevated LDH is.  She was given steroids during this hospitalization this exacerbated her pre-existing type 2 diabetes.  Nevertheless the patient did not require intubation the patient was weaned off oxygen and subsequently discharged from the hospital in improved condition.  The patient states her Dickson symptom now is a slight sensation in the nose.  She has required her taste and smell again.  She does not have cough or shortness of breath.  There is no body aches or fatigue.  There does not appear to be any residual with regards to her code COVID-19 infection.  She has finished her course of prednisone and with this her blood sugars are come back down to a normal level.  Below is the discharge  summary Admit date: 12/29/2018  Discharge date: 01/08/2019  Admitted From: Hannah   Disposition:  Hannah   Recommendations for Outpatient Follow-up:   Follow up with Hannah Dickson in 1-2 weeks  Hannah Dickson Please obtain BMP/CBC, 2 view CXR in 1week,  (see Discharge instructions)   Hannah Dickson Please follow up on the following pending results:    Hannah Dickson: None   Equipment/Devices: None  Consultations: None  Discharge Condition: Stable    CODE STATUS: Full    Diet Recommendation: Heart Healthy Low Carb       Chief Complaint  Patient presents with  . Nausea     Brief history of present illness from the day of admission and additional interim summary    Hannah Morales-Calderonis an 53 y.o.femaleSpanish-speaking Dickson with past medical history of autoimmune hemolytic anemia, diabetes mellitus and hypertension who presents to the ED after 3 to 4 days of abdominal pain nausea and vomiting. She had exposure to COVID positive husband for a little while, in the ER she was found to have a hemoglobin of 3.2 and an elevated bilirubin of 6.5. Her work-up was suggestive of COVID-19 infection along with acute decompensation of her chronic hemolytic anemia and she was admitted.  Hospital Course    1. Acute Covid 19 Viral Pneumonitis during the ongoing 2020 Covid 19 Pandemic - no hypoxia or oxygen demand, stable inflammatory markers, she remained symptom-free on room air and will be discharged on short course of steroid taper orally.  COVID-19 Labs  Recent Labs (last 2 labs)       Recent Labs    01/06/19 0430 01/07/19 0300  FERRITIN 662* 676*  LDH 250* 250*  CRP 2.4* 1.3*      Recent Labs       Lab Results  Component Value Date   SARSCOV2NAA POSITIVE (A) 12/29/2018       2. Acute decompensation of chronic hemolytic anemia causing hemolytic blood loss requiring 4 units of packed RBC transfusion this admission,  she received 1 unit on 7/21/2020which was her last transfusion, currently no signs of ongoing hemolysis, H&H stable, platelets stable, continue steroids. Post discharge will follow with Hannah Dickson. Son requested to arrange for outpatient follow-up within a week.Repeat H&H remained stable and gradually trending up, no signs of ongoing hemolysis with stable LDH, haptoglobin and total bilirubin, will be discharged on short course of oral short acting insulin 3 times daily with with outpatient hematology and Hannah Dickson follow-up.  3. Elevated total bilirubin causing jaundice. Due to hemolysis. Currently resolved with supportive care.  4. Mild thrombocytopenia. Likely due to viral infection, HIT panel -ve, counts arenow risingno signs of bleeding for now.    5. Hypotension and hyponatremia. Resolved after packed RBC transfusion and IV fluids.  6. DM type II. A1c 6.4.   Comments Hannah regimen since she is being discharged on a steroid taper hence she has been provided NovoLog sliding scale with written instructions and testing supplies.  Diabetic and insulin education will be provided prior to discharge.   Discharge diagnosis     Principal Problem:   Acute hemolytic anemia (HCC) Active Problems:   Type 2 diabetes mellitus, uncontrolled (HCC)   Warm reactive antibody (HCC)   Hypotension   Hyperbilirubinemia   Acute metabolic encephalopathy   Constitutional:   No  weight loss, night sweats,  Fevers, chills, fatigue, lassitude. HEENT:   No headaches,  Difficulty swallowing,  Tooth/dental problems,  Sore throat,                 sneezing, itching, ear ache, nasal congestion, post nasal drip, can taste and smell CV:  No chest pain,  Orthopnea, PND, swelling in lower extremities, anasarca, dizziness, palpitations  GI  No heartburn, indigestion, abdominal pain, nausea, vomiting, diarrhea, change in bowel habits, loss of appetite  Resp: No shortness of breath with exertion  or at rest.  No excess mucus, no productive cough,  No non-productive cough,  No coughing up of blood.  No change in color of mucus.  No wheezing.  No chest wall deformity  Skin: no rash or lesions.  GU: no dysuria, change in color of urine, no urgency or frequency.  No flank pain.  MS:  No joint pain or swelling.  No decreased range of motion.  No back pain.  Psych:  No change in mood or affect. No depression or anxiety.  No memory loss.  Observations/Objective: There are no observations as this was a telephone visit  Assessment and Plan: #1 COVID-19 viral pneumonia now resolved: No indication for additional imaging or steroids  #2 type 2 diabetes with improved control: For now we will continue metformin at thousand milligrams twice daily along with Amaryl 4 mg daily and  sliding scale NovoLog insulin  #3 transaminitis secondary to COVID-19 infection: For now we will hold atorvastatin and follow-up with repeat liver function profile on return to the office  #4 autoimmune hemolytic anemia [facility]  I encouraged the patient to follow-up with her blood count monitoring [facility]  Follow Up Instructions: The patient knows will be an in office exam scheduled   I discussed the assessment and treatment plan with the patient. The patient was provided an opportunity to ask questions and all were answered. The patient agreed with the plan and demonstrated an understanding of the instructions.   The patient was advised to call back or seek an in-person evaluation if the symptoms worsen or if the condition fails to improve as anticipated.  I provided 30 minutes of non-face-to-face time during this encounter  including  median intraservice time , review of notes, labs, imaging, medications  and explaining diagnosis and management to the patient .    Shan LevansPatrick Wright, MD

## 2019-02-10 NOTE — Progress Notes (Signed)
Pt states her sugar this morning was 135

## 2019-02-11 ENCOUNTER — Telehealth: Payer: Self-pay | Admitting: Critical Care Medicine

## 2019-02-11 NOTE — Telephone Encounter (Signed)
Attempted to reach patient no answer unable to LVM °

## 2019-02-11 NOTE — Telephone Encounter (Signed)
-----   Message from Jackelyn Knife, [place] sent at 02/10/2019 10:24 AM EDT ----- Please contact pt and schedule a 3 week in person visit with Dr. Deeann Saint to double book)

## 2020-02-13 IMAGING — CT CT ABDOMEN AND PELVIS WITH CONTRAST
2 of 5 series · 16 of 46 positions shown, 18 images · IV contrast (Isovue)
Comparison: None.

CLINICAL DATA: Pt has been nauseous for the last 4 days. Has thrown
up 3-4 times in the last 24 hours. Is having abdominal pain and is
jaundiced. No history of liver disease.

EXAM:
CT ABDOMEN AND PELVIS WITH CONTRAST
TECHNIQUE: Multidetector CT imaging of the abdomen and pelvis was performed
using the standard protocol following bolus administration of
intravenous contrast.
CONTRAST:  100mL OMNIPAQUE IOHEXOL 300 MG/ML  SOLN

[Series 2: axial st · axial · 0.72mm/px · z∈[+818,+1232]mm · 13 of 95 slices shown, 15 images]
[im 6/95  soft-tissue]
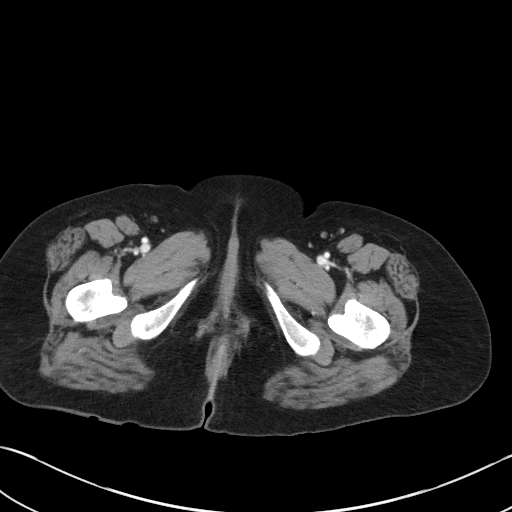
[im 6/95  bone]
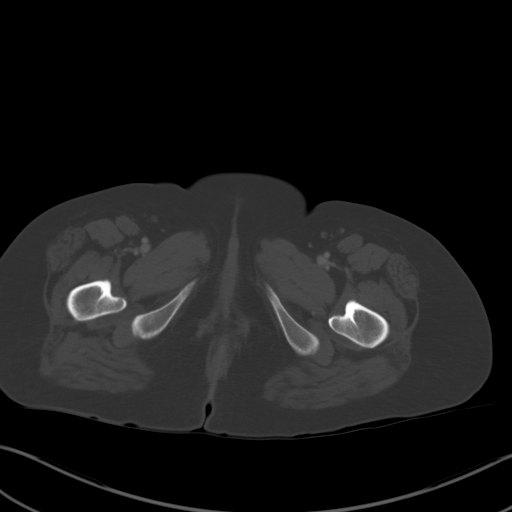
[im 12/95  soft-tissue]
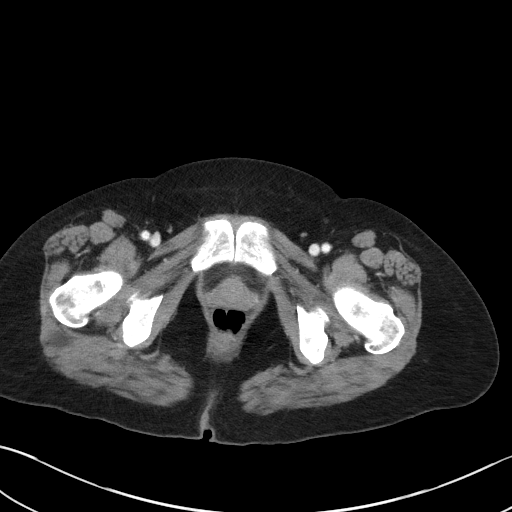
[im 18/95  soft-tissue]
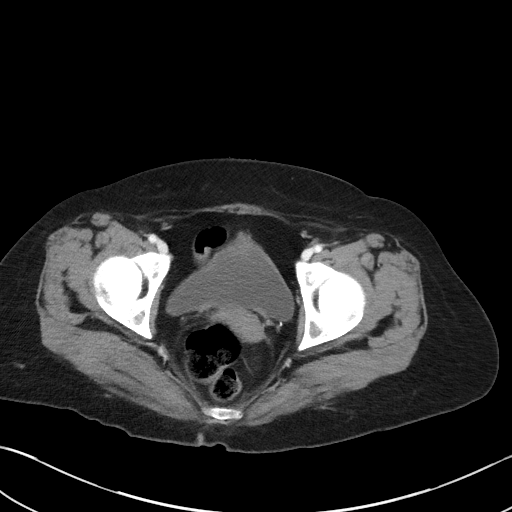
[im 30/95  soft-tissue]
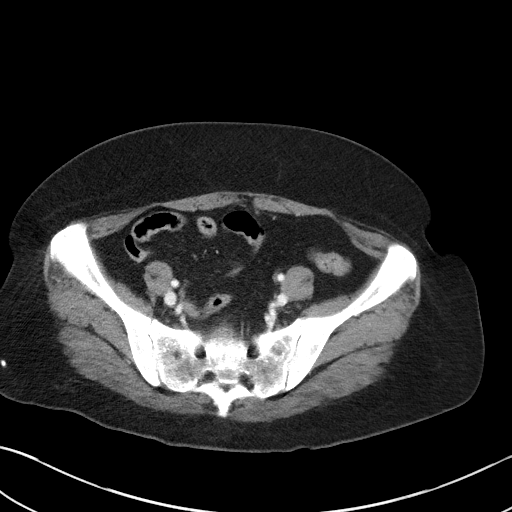
[im 36/95  soft-tissue]
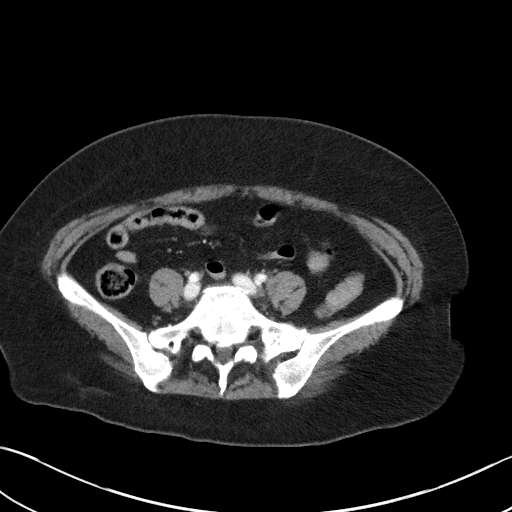
[im 42/95  soft-tissue]
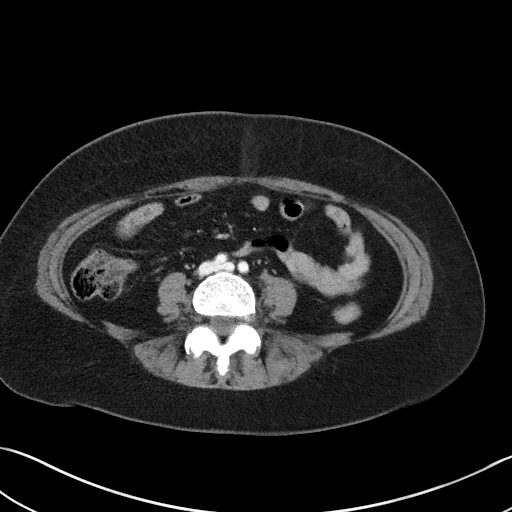
[im 48/95  soft-tissue]
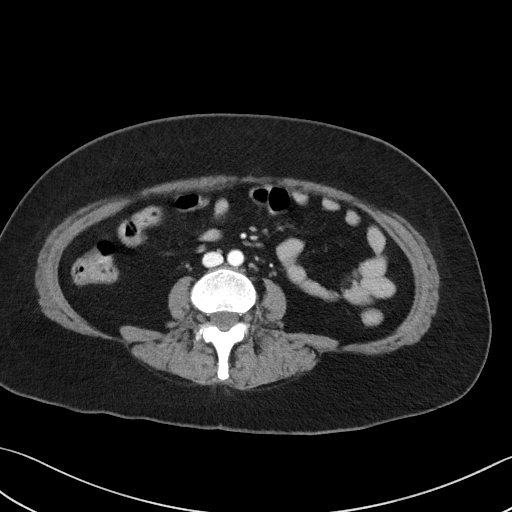
[im 53/95  soft-tissue]
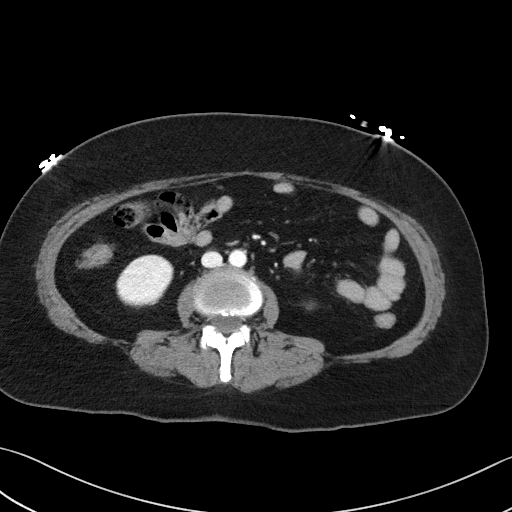
[im 59/95  soft-tissue]
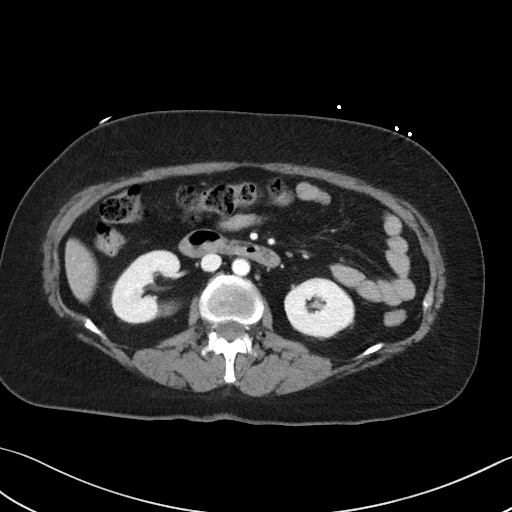
[im 59/95  bone]
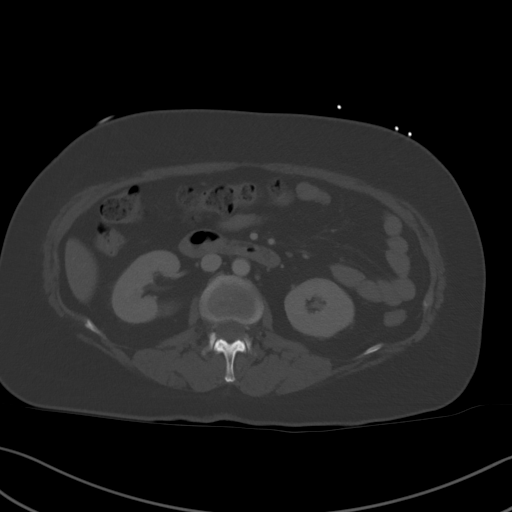
[im 65/95  soft-tissue]
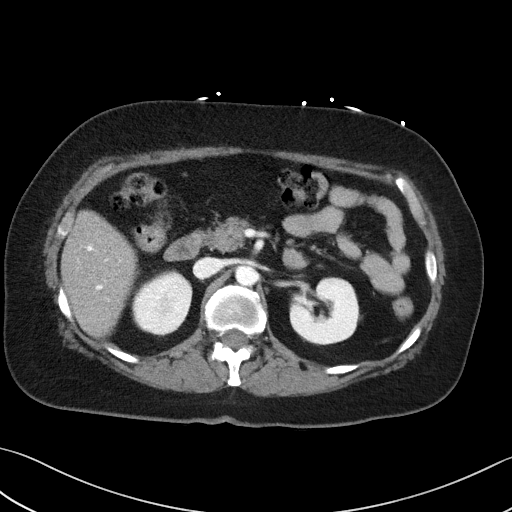
[im 77/95  soft-tissue]
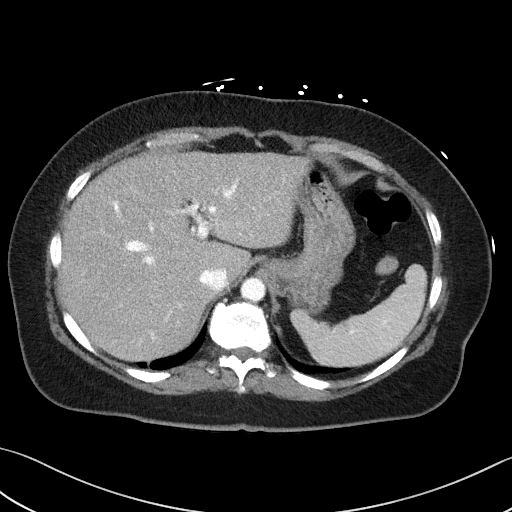
[im 83/95  soft-tissue]
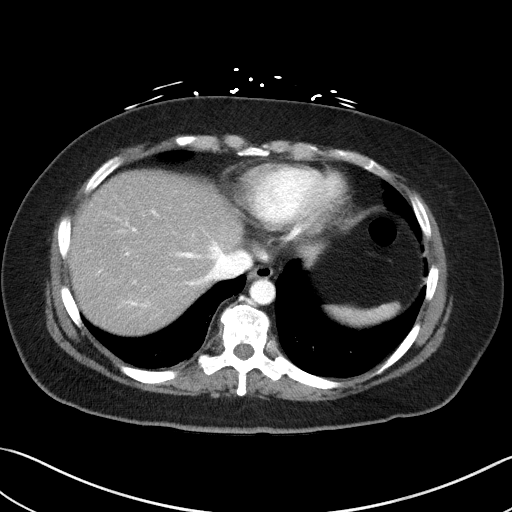
[im 89/95  soft-tissue]
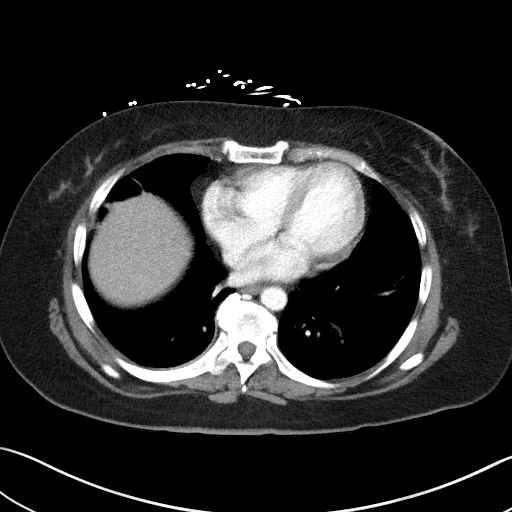

[Series 4: coronal st · coronal · 0.70mm/px · 3 of 81 slices shown]
[im 27/81  soft-tissue]
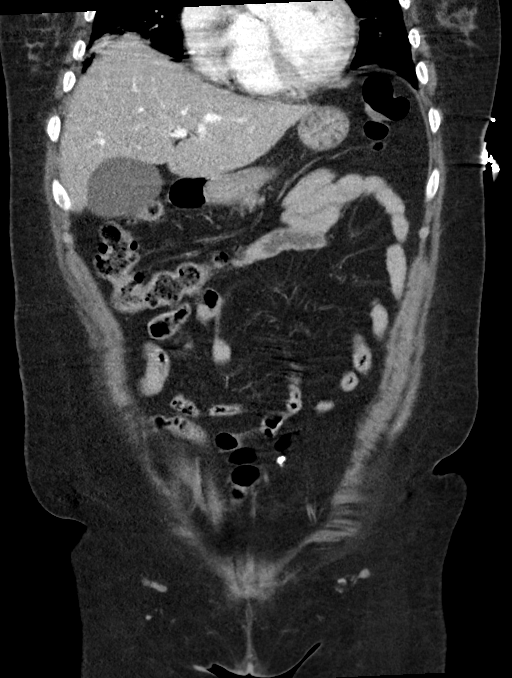
[im 36/81  soft-tissue]
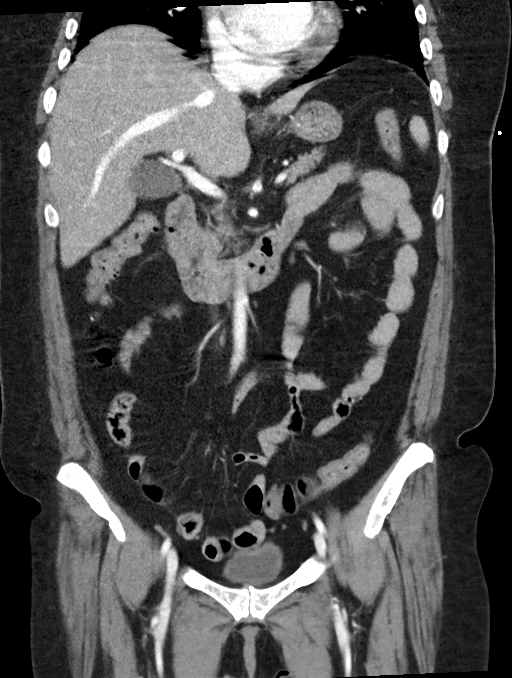
[im 45/81  soft-tissue]
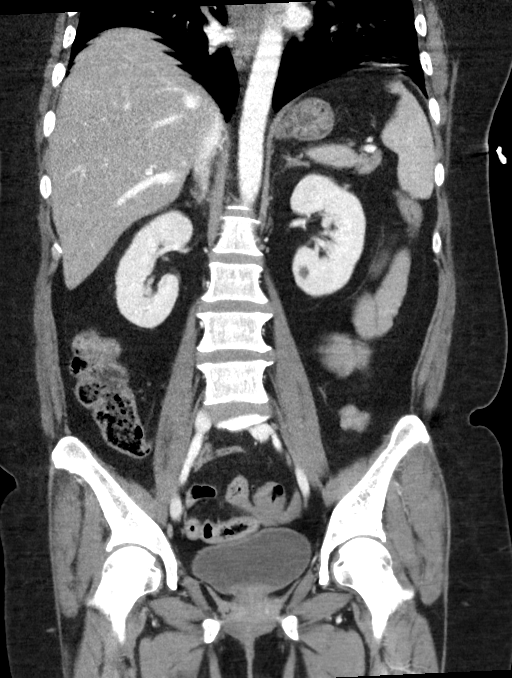

[16 of 46 positions shown; findings below may reference images not displayed]

FINDINGS: Lower chest: No acute abnormality.

Hepatobiliary: No focal liver abnormality is seen. No gallstones,
gallbladder wall thickening, or biliary dilatation.

Pancreas: Unremarkable. No pancreatic ductal dilatation or
surrounding inflammatory changes.

Spleen: Normal in size without focal abnormality.

Adrenals/Urinary Tract: Adrenal glands are unremarkable. Kidneys are
normal, without renal calculi, focal lesion, or hydronephrosis.
Bladder is unremarkable.

Stomach/Bowel: Stomach is within normal limits. Appendix appears
normal. No evidence of bowel wall thickening, distention, or
inflammatory changes.

Vascular/Lymphatic: No significant vascular findings are present. No
enlarged abdominal or pelvic lymph nodes.

Reproductive: Uterus and bilateral adnexa are unremarkable.

Other: No abdominal wall hernia or abnormality. No abdominopelvic
ascites.

Musculoskeletal: No acute osseous abnormality. No aggressive osseous
lesion. Bilateral facet arthropathy at L5-S1.
IMPRESSION: 1. No acute abdominal or pelvic pathology.
2. Normal appendix.

## 2020-06-24 ENCOUNTER — Ambulatory Visit: Payer: Self-pay | Attending: Internal Medicine

## 2020-06-24 DIAGNOSIS — Z23 Encounter for immunization: Secondary | ICD-10-CM

## 2020-06-24 NOTE — Progress Notes (Signed)
   Covid-19 Vaccination Clinic  Name:  Hannah Dickson    MRN: [id number] DOB: March 01, 1966  06/24/2020  Hannah Dickson was observed post Covid-19 immunization for 15 minutes without incident. She was provided with Vaccine Information Sheet and instruction to access the V-Safe system.   Hannah Dickson was instructed to call 911 with any severe reactions post vaccine: Marland Kitchen Difficulty breathing  . Swelling of face and throat  . A fast heartbeat  . A bad rash all over body  . Dizziness and weakness   Immunizations Administered    Name Date Dose VIS Date Route   Pfizer COVID-19 Vaccine 06/24/2020 10:42 AM 0.3 mL 04/05/2020 Intramuscular   Manufacturer: [employer], [employer]   Lot: [id number]   NDC: [address]-4

## 2022-07-18 ENCOUNTER — Other Ambulatory Visit: Payer: Self-pay | Admitting: Obstetrics and Gynecology

## 2022-07-18 DIAGNOSIS — Z1231 Encounter for screening mammogram for malignant neoplasm of breast: Secondary | ICD-10-CM

## 2022-08-09 ENCOUNTER — Ambulatory Visit (HOSPITAL_COMMUNITY)
Admission: RE | Admit: 2022-08-09 | Discharge: 2022-08-09 | Disposition: A | Discharge status: Home / Self Care | Payer: Self-pay | Source: Ambulatory Visit | Attending: Obstetrics and Gynecology | Admitting: Obstetrics and Gynecology

## 2022-08-09 ENCOUNTER — Inpatient Hospital Stay: Payer: Self-pay | Attending: Obstetrics and Gynecology | Admitting: Hematology and Oncology

## 2022-08-09 VITALS — BP 157/77 | Wt 150.4 lb

## 2022-08-09 DIAGNOSIS — Z1231 Encounter for screening mammogram for malignant neoplasm of breast: Secondary | ICD-10-CM | POA: Insufficient documentation

## 2022-08-09 NOTE — Patient Instructions (Addendum)
Taught Hannah Dickson how to perform BSE and gave educational materials to take home. Patient did not need a Pap smear today due to last Pap smear was in 2022 per patient. Told patient about free cervical cancer screenings to receive a Pap smear if would like one next year. Let her know [facility] will cover Pap smears every 3 years unless has a history of abnormal Pap smears. Referred patient to the [facility] for diagnostic mammogram. Appointment scheduled for 08/09/22. Patient aware of appointment and will be there. Let patient know will follow up with her within the next couple weeks with results. Hannah Dickson verbalized understanding.  Melodye Ped, NP 9:44 AM

## 2022-08-09 NOTE — Progress Notes (Signed)
Ms. Hannah Dickson is a 57 y.o. female who presents to [facility] clinic today with no complaints.    Pap Smear: Pap not smear completed today. Last Pap smear was 2022 and was normal. Per patient has no history of an abnormal Pap smear. Last Pap smear result is not available in Epic.   Physical exam: Breasts Breasts symmetrical. No skin abnormalities bilateral breasts. No nipple retraction bilateral breasts. No nipple discharge bilateral breasts. No lymphadenopathy. No lumps palpated bilateral breasts. MM 3D SCREEN BREAST BILATERAL  Result Date: 02/10/2019 CLINICAL DATA:  Screening. EXAM: DIGITAL SCREENING BILATERAL MAMMOGRAM WITH TOMO AND CAD COMPARISON:  Previous exam(s). ACR Breast Density Category b: There are scattered areas of fibroglandular density. FINDINGS: There are no findings suspicious for malignancy. Images were processed with CAD. IMPRESSION: No mammographic evidence of malignancy. A result letter of this screening mammogram will be mailed directly to the patient. RECOMMENDATION: Screening mammogram in one year. (Code:SM-B-01Y) BI-RADS CATEGORY  1: Negative. Electronically Signed   By: Lajean Manes M.D.   On: 02/10/2019 10:30   MS DIGITAL SCREENING TOMO BILATERAL  Result Date: 08/26/2017 CLINICAL DATA:  Screening. EXAM: DIGITAL SCREENING BILATERAL MAMMOGRAM WITH TOMO AND CAD COMPARISON:  Previous exam(s). ACR Breast Density Category b: There are scattered areas of fibroglandular density. FINDINGS: There are no findings suspicious for malignancy. Images were processed with CAD. IMPRESSION: No mammographic evidence of malignancy. A result letter of this screening mammogram will be mailed directly to the patient. RECOMMENDATION: Screening mammogram in one year. (Code:SM-B-01Y) BI-RADS CATEGORY  1: Negative. Electronically Signed   By: Franki Cabot M.D.   On: 08/26/2017 09:56          Pelvic/Bimanual Pap is not indicated today    Smoking History: Patient has never smoked and  was not referred to quit line.    Patient Navigation: Patient education provided. Access to services provided for patient through [place] ([employer]) interpreter provided. No transportation provided   Colorectal Cancer Screening: Per patient has never had colonoscopy completed No complaints today.    Breast and Cervical Cancer Risk Assessment: Patient does not have family history of breast cancer, known genetic mutations, or radiation treatment to the chest before age 55. Patient does not have history of cervical dysplasia, immunocompromised, or DES exposure in-utero.  Risk Scores as of 08/09/2022     Hannah Dickson           5-year 1.55 %   Lifetime 10.01 %   This patient is [place]/Latina but has no documented birth country, so the [place] used data from [place] patients to calculate their risk score. Document a birth country in the Demographics activity for a more accurate score.         Last calculated by Demetrius Revel, LPN on [id number] at  9:49 AM        A: [facility] exam without pap smear No complaints with benign exam.   P: [facility]. Appointment scheduled 08/09/22.  Melodye Ped, NP 08/09/2022 9:57 AM

## 2022-08-14 NOTE — Progress Notes (Signed)
Patient not given FIT test due to history of AIHA, history of rectal bleeding, hemolytic anemia, currently undergoing treatment at [facility].
